# Patient Record
Sex: Male | Born: 1940 | Race: White | Hispanic: No | Marital: Married | Smoking: Never smoker
Health system: Southern US, Community
[De-identification: ages and names within clinical notes are randomized; demographics above are authoritative.]

## PROBLEM LIST (undated history)

## (undated) DIAGNOSIS — M109 Gout, unspecified: Secondary | ICD-10-CM

## (undated) DIAGNOSIS — E7800 Pure hypercholesterolemia, unspecified: Secondary | ICD-10-CM

## (undated) DIAGNOSIS — C61 Malignant neoplasm of prostate: Secondary | ICD-10-CM

## (undated) HISTORY — PX: CATARACT EXTRACTION: SUR2

## (undated) HISTORY — DX: Gout, unspecified: M10.9

## (undated) HISTORY — PX: KIDNEY STONE SURGERY: SHX686

---

## 2017-03-22 ENCOUNTER — Inpatient Hospital Stay (HOSPITAL_COMMUNITY): Payer: Medicare Other

## 2017-03-22 ENCOUNTER — Encounter (HOSPITAL_COMMUNITY): Payer: Self-pay | Admitting: Emergency Medicine

## 2017-03-22 ENCOUNTER — Inpatient Hospital Stay (HOSPITAL_COMMUNITY)
Admission: EM | Admit: 2017-03-22 | Discharge: 2017-03-24 | DRG: 872 | Disposition: A | Discharge status: Home / Self Care | Payer: Medicare Other | Attending: Internal Medicine | Admitting: Internal Medicine

## 2017-03-22 DIAGNOSIS — R338 Other retention of urine: Secondary | ICD-10-CM | POA: Diagnosis not present

## 2017-03-22 DIAGNOSIS — R339 Retention of urine, unspecified: Secondary | ICD-10-CM | POA: Diagnosis present

## 2017-03-22 DIAGNOSIS — E7800 Pure hypercholesterolemia, unspecified: Secondary | ICD-10-CM | POA: Diagnosis present

## 2017-03-22 DIAGNOSIS — N12 Tubulo-interstitial nephritis, not specified as acute or chronic: Secondary | ICD-10-CM | POA: Diagnosis present

## 2017-03-22 DIAGNOSIS — N139 Obstructive and reflux uropathy, unspecified: Secondary | ICD-10-CM | POA: Diagnosis present

## 2017-03-22 DIAGNOSIS — N3000 Acute cystitis without hematuria: Secondary | ICD-10-CM | POA: Diagnosis not present

## 2017-03-22 DIAGNOSIS — N10 Acute tubulo-interstitial nephritis: Secondary | ICD-10-CM | POA: Diagnosis not present

## 2017-03-22 DIAGNOSIS — Z792 Long term (current) use of antibiotics: Secondary | ICD-10-CM

## 2017-03-22 DIAGNOSIS — A419 Sepsis, unspecified organism: Secondary | ICD-10-CM | POA: Diagnosis present

## 2017-03-22 DIAGNOSIS — C61 Malignant neoplasm of prostate: Secondary | ICD-10-CM | POA: Diagnosis present

## 2017-03-22 DIAGNOSIS — E876 Hypokalemia: Secondary | ICD-10-CM | POA: Diagnosis present

## 2017-03-22 DIAGNOSIS — E869 Volume depletion, unspecified: Secondary | ICD-10-CM | POA: Diagnosis present

## 2017-03-22 DIAGNOSIS — N390 Urinary tract infection, site not specified: Secondary | ICD-10-CM | POA: Diagnosis present

## 2017-03-22 DIAGNOSIS — N179 Acute kidney failure, unspecified: Secondary | ICD-10-CM | POA: Diagnosis present

## 2017-03-22 DIAGNOSIS — Z79899 Other long term (current) drug therapy: Secondary | ICD-10-CM | POA: Diagnosis not present

## 2017-03-22 DIAGNOSIS — R509 Fever, unspecified: Secondary | ICD-10-CM | POA: Diagnosis not present

## 2017-03-22 DIAGNOSIS — E785 Hyperlipidemia, unspecified: Secondary | ICD-10-CM | POA: Diagnosis present

## 2017-03-22 HISTORY — DX: Malignant neoplasm of prostate: C61

## 2017-03-22 HISTORY — DX: Pure hypercholesterolemia, unspecified: E78.00

## 2017-03-22 LAB — BASIC METABOLIC PANEL
Anion gap: 11 (ref 5–15)
BUN: 29 mg/dL — AB (ref 6–20)
CHLORIDE: 97 mmol/L — AB (ref 101–111)
CO2: 26 mmol/L (ref 22–32)
Calcium: 9.7 mg/dL (ref 8.9–10.3)
Creatinine, Ser: 1.87 mg/dL — ABNORMAL HIGH (ref 0.61–1.24)
GFR calc Af Amer: 39 mL/min — ABNORMAL LOW (ref >60)
GFR calc non Af Amer: 34 mL/min — ABNORMAL LOW (ref >60)
GLUCOSE: 124 mg/dL — AB (ref 65–99)
POTASSIUM: 3.9 mmol/L (ref 3.5–5.1)
Sodium: 134 mmol/L — ABNORMAL LOW (ref 135–145)

## 2017-03-22 LAB — CBC WITH DIFFERENTIAL/PLATELET
Basophils Absolute: 0 10*3/uL (ref 0.0–0.1)
Basophils Relative: 0 %
Eosinophils Absolute: 0 10*3/uL (ref 0.0–0.7)
Eosinophils Relative: 0 %
HCT: 37.6 % — ABNORMAL LOW (ref 39.0–52.0)
HEMOGLOBIN: 13.1 g/dL (ref 13.0–17.0)
LYMPHS ABS: 1.1 10*3/uL (ref 0.7–4.0)
LYMPHS PCT: 6 %
MCH: 32.7 pg (ref 26.0–34.0)
MCHC: 34.8 g/dL (ref 30.0–36.0)
MCV: 93.8 fL (ref 78.0–100.0)
Monocytes Absolute: 1.6 10*3/uL — ABNORMAL HIGH (ref 0.1–1.0)
Monocytes Relative: 8 %
NEUTROS ABS: 16.5 10*3/uL — AB (ref 1.7–7.7)
NEUTROS PCT: 86 %
Platelets: 227 10*3/uL (ref 150–400)
RBC: 4.01 MIL/uL — AB (ref 4.22–5.81)
RDW: 12.6 % (ref 11.5–15.5)
WBC: 19.1 10*3/uL — AB (ref 4.0–10.5)

## 2017-03-22 LAB — URINALYSIS, ROUTINE W REFLEX MICROSCOPIC
Bilirubin Urine: NEGATIVE
Glucose, UA: NEGATIVE mg/dL
Ketones, ur: NEGATIVE mg/dL
Nitrite: POSITIVE — AB
Protein, ur: 100 mg/dL — AB
SPECIFIC GRAVITY, URINE: 1.008 (ref 1.005–1.030)
pH: 5 (ref 5.0–8.0)

## 2017-03-22 LAB — LACTIC ACID, PLASMA: LACTIC ACID, VENOUS: 1 mmol/L (ref 0.5–1.9)

## 2017-03-22 MED ORDER — HYDROCODONE-ACETAMINOPHEN 5-325 MG PO TABS: 1.0000 | ORAL_TABLET | Freq: Four times a day (QID) | ORAL | Status: DC | PRN

## 2017-03-22 MED ORDER — SIMVASTATIN 20 MG PO TABS
40.0000 mg | ORAL_TABLET | Freq: Every day | ORAL | Status: DC
  Administered 2017-03-22 – 2017-03-23 (×2): 40 mg via ORAL
  Filled 2017-03-22 (×2): qty 2

## 2017-03-22 MED ORDER — ONDANSETRON HCL 4 MG/2ML IJ SOLN: 4.0000 mg | Freq: Four times a day (QID) | INTRAMUSCULAR | Status: DC | PRN

## 2017-03-22 MED ORDER — DEXTROSE 5 % IV SOLN
2.0000 g | Freq: Once | INTRAVENOUS | Status: AC
  Administered 2017-03-22: 2 g via INTRAVENOUS
  Filled 2017-03-22: qty 2

## 2017-03-22 MED ORDER — ONDANSETRON HCL 4 MG/2ML IJ SOLN
4.0000 mg | Freq: Once | INTRAMUSCULAR | Status: DC
  Filled 2017-03-22: qty 2

## 2017-03-22 MED ORDER — ACETAMINOPHEN 325 MG PO TABS
650.0000 mg | ORAL_TABLET | Freq: Four times a day (QID) | ORAL | Status: DC | PRN
  Administered 2017-03-22: 650 mg via ORAL
  Filled 2017-03-22: qty 2

## 2017-03-22 MED ORDER — DEXTROSE 5 % IV SOLN
2.0000 g | INTRAVENOUS | Status: DC
  Administered 2017-03-23: 2 g via INTRAVENOUS
  Filled 2017-03-22 (×2): qty 2

## 2017-03-22 MED ORDER — ENOXAPARIN SODIUM 40 MG/0.4ML ~~LOC~~ SOLN
40.0000 mg | SUBCUTANEOUS | Status: DC
  Administered 2017-03-22 – 2017-03-23 (×2): 40 mg via SUBCUTANEOUS
  Filled 2017-03-22 (×2): qty 0.4

## 2017-03-22 MED ORDER — ONDANSETRON HCL 4 MG PO TABS: 4.0000 mg | ORAL_TABLET | Freq: Four times a day (QID) | ORAL | Status: DC | PRN

## 2017-03-22 MED ORDER — ACETAMINOPHEN 650 MG RE SUPP: 650.0000 mg | Freq: Four times a day (QID) | RECTAL | Status: DC | PRN

## 2017-03-22 MED ORDER — TAMSULOSIN HCL 0.4 MG PO CAPS
0.4000 mg | ORAL_CAPSULE | Freq: Every day | ORAL | Status: DC
  Administered 2017-03-23 – 2017-03-24 (×2): 0.4 mg via ORAL
  Filled 2017-03-22 (×2): qty 1

## 2017-03-22 MED ORDER — CO Q 10 100 MG PO CAPS: ORAL_CAPSULE | Freq: Every day | ORAL | Status: DC

## 2017-03-22 MED ORDER — SODIUM CHLORIDE 0.9 % IV BOLUS (SEPSIS)
1000.0000 mL | Freq: Once | INTRAVENOUS | Status: AC
  Administered 2017-03-22: 1000 mL via INTRAVENOUS

## 2017-03-22 MED ORDER — ACETAMINOPHEN 325 MG PO TABS
650.0000 mg | ORAL_TABLET | Freq: Once | ORAL | Status: AC
  Administered 2017-03-22: 650 mg via ORAL
  Filled 2017-03-22: qty 2

## 2017-03-22 NOTE — Progress Notes (Signed)

## 2017-03-22 NOTE — ED Provider Notes (Signed)
[place] DEPT Provider Note   CSN: [id number] Arrival date & time: 03/22/17  1414     History   Chief Complaint Chief Complaint  Patient presents with  . Fever    HPI Brian Love is a 76 y.o. male.Chief complaint is fevers shakes and chills  HPI 76 year old male with prostate cancer diagnosed December 2017. Completed 25 courses of radiation therapy on June 21. A Foley catheter after radium implants were placed. Developed UTI and placed on Macrobid 2 weeks ago. His last dose on Sunday, 5 days ago. Removed catheter Monday, 4 days ago. Has been urinating frequently and well since that time. Felt warm yesterday and had temp of 102 last night. Was seen yesterday at his primary care physician's office because of some early morning fever. He and family state that his urine was "very impressive" per the report of his primary care physician's office. No culture was sent.  Fever shakes chills this morning. He presents here.  He states that he was so weak and shaking so violently at home and on the way and he did not feel he could "make it" anywhere else.Marland Kitchen Has been wearing a depends because of his urinary frequency. Does not currently have indwelling Foley.  Temp with my exam 102.7  Past Medical History:  Diagnosis Date  . High cholesterol   . Prostate cancer ([place])     There are no active problems to display for this patient.   Past Surgical History:  Procedure Laterality Date  . CATARACT EXTRACTION    . KIDNEY STONE SURGERY         Home Medications    Prior to Admission medications   Not on File    Family History History reviewed. No pertinent family history.  Social History Social History  Substance Use Topics  . Smoking status: Never Smoker  . Smokeless tobacco: Never Used  . Alcohol use Yes     Comment: occasionally     Allergies   Patient has no known allergies.   Review of Systems Review of Systems  Constitutional: Positive for chills and  fever. Negative for appetite change, diaphoresis and fatigue.  HENT: Negative for mouth sores, sore throat and trouble swallowing.   Eyes: Negative for visual disturbance.  Respiratory: Negative for cough, chest tightness, shortness of breath and wheezing.   Cardiovascular: Negative for chest pain.  Gastrointestinal: Negative for abdominal distention, abdominal pain, diarrhea, nausea and vomiting.  Endocrine: Negative for polydipsia, polyphagia and polyuria.  Genitourinary: Negative for dysuria, frequency and hematuria.  Musculoskeletal: Negative for gait problem.  Skin: Negative for color change, pallor and rash.  Neurological: Positive for weakness. Negative for dizziness, syncope, light-headedness and headaches.  Hematological: Does not bruise/bleed easily.  Psychiatric/Behavioral: Negative for behavioral problems and confusion.     Physical Exam Updated Vital Signs BP (!) 146/78 (BP Location: Right Arm)   Pulse 92   Temp 100.1 F (37.8 C) (Oral)   Resp 16   Ht 5\' 10"  (1.778 m)   Wt 85.7 kg (189 lb)   SpO2 94%   BMI 27.12 kg/m   Physical Exam  Constitutional: He is oriented to person, place, and time. He appears well-developed and well-nourished. No distress.  Temperature 102.7.  HENT:  Head: Normocephalic.  Eyes: Conjunctivae are normal. Pupils are equal, round, and reactive to light. No scleral icterus.  Neck: Normal range of motion. Neck supple. No thyromegaly present.  Cardiovascular: Normal rate and regular rhythm.  Exam reveals no gallop and no  friction rub.   No murmur heard. Pulmonary/Chest: Effort normal and breath sounds normal. No respiratory distress. He has no wheezes. He has no rales.  Abdominal: Soft. Bowel sounds are normal. He exhibits no distension. There is no tenderness. There is no rebound.  Musculoskeletal: Normal range of motion.  Neurological: He is alert and oriented to person, place, and time.  Skin: Skin is warm and dry. No rash noted.    Psychiatric: He has a normal mood and affect. His behavior is normal.     ED Treatments / Results  Labs (all labs ordered are listed, but only abnormal results are displayed) Labs Reviewed  URINALYSIS, ROUTINE W REFLEX MICROSCOPIC - Abnormal; Notable for the following:       Result Value   APPearance TURBID (*)    Hgb urine dipstick MODERATE (*)    Protein, ur 100 (*)    Nitrite POSITIVE (*)    Leukocytes, UA MODERATE (*)    Bacteria, UA FEW (*)    Squamous Epithelial / LPF 0-5 (*)    Non Squamous Epithelial 0-5 (*)    All other components within normal limits  CBC WITH DIFFERENTIAL/PLATELET - Abnormal; Notable for the following:    WBC 19.1 (*)    RBC 4.01 (*)    HCT 37.6 (*)    Neutro Abs 16.5 (*)    Monocytes Absolute 1.6 (*)    All other components within normal limits  BASIC METABOLIC PANEL - Abnormal; Notable for the following:    Sodium 134 (*)    Chloride 97 (*)    Glucose, Bld 124 (*)    BUN 29 (*)    Creatinine, Ser 1.87 (*)    GFR calc non Af Amer 34 (*)    GFR calc Af Amer 39 (*)    All other components within normal limits  URINE CULTURE  CULTURE, BLOOD (ROUTINE X 2)  CULTURE, BLOOD (ROUTINE X 2)  LACTIC ACID, PLASMA  LACTIC ACID, PLASMA    EKG  EKG Interpretation None       Radiology No results found.  Procedures Procedures (including critical care time)  Medications Ordered in ED Medications  ondansetron (ZOFRAN) injection 4 mg (4 mg Intravenous Refused 03/22/17 1716)  sodium chloride 0.9 % bolus 1,000 mL (0 mLs Intravenous Stopped 03/22/17 1704)  cefTRIAXone (ROCEPHIN) 2 g in dextrose 5 % 50 mL IVPB (0 g Intravenous Stopped 03/22/17 1556)  acetaminophen (TYLENOL) tablet 650 mg (650 mg Oral Given 03/22/17 1503)     Initial Impression / Assessment and Plan / ED Course  I have reviewed the triage vital signs and the nursing notes.  Pertinent labs & imaging results that were available during my care of the patient were reviewed by me and  considered in my medical decision making (see chart for details).   Plan fluids, labs including lactate blood and urine cultures urinalysis. IV Rocephin. We'll reevaluate after the above.  17:15:  He became nauseated and had an episode of emesis. Overall continues to appear well. Not tachycardic. Reassuring lactate. Leukocytosis of 19,000. Creatinine 1.87. He does not know his baseline. Urine appears infected. Blood and cultures are pending. He was given 2 g IV Rocephin.  I requested an ultrasound to ensure that there is no obstruction. He continues to have urinary frequency and feels as though he is emptying. With emesis, and probable bacteremia after 24 hours of Levaquin feel that admission for fluids, recheck creatinine, and additional antibiotics are indicated. We'll discuss with hospitalist.  Final Clinical Impressions(s) / ED Diagnoses   Final diagnoses:  Lower urinary tract infectious disease  Acute kidney injury ([place])  Fever, unspecified fever cause    New Prescriptions New Prescriptions   No medications on file     Tanna Furry, MD 03/22/17 1717

## 2017-03-22 NOTE — ED Notes (Addendum)
Pt is now throwing up in trash can. Will alert MD

## 2017-03-22 NOTE — ED Triage Notes (Signed)
Patient states he is being treated for UTI and was told by PCP if he was no better to go to ER or [facility]. Patient complaining of fever and lower abdominal pain. Patient recently finished radiation for prostate cancer.

## 2017-03-22 NOTE — H&P (Signed)
History and Physical  Brian Love QPY:[id number] DOB: 06/30/1941 DOA: 03/22/2017  Referring physician: Dr Jeneen Rinks, ED physician PCP: Glenda Chroman, MD  Outpatient Specialists:   Dr Rosana Hoes (Urology with [place])  [facility] Med Radiation oncology  Patient Coming From: home  Chief Complaint: Fever, chills  HPI: Brian Love is a 76 y.o. male with a history of cancer and high cholesterol. Patient on brachy therapy his prostate cancer, which was initiated on 02/20/17. After placement of brachytherapy, the patient had Foley catheter placed - he removed it as instructed but experienced frequency and urinary retention. The Foley was replaced and the patient returned on 6/13 for voiding trial, which initially passed, then again developed frequency and retention. He presented to emergency department on 6/24 for Foley placement and was placed on nitrofurantoin for bladder infection. He was seen in his urologist office on 6/27, instructed to take ibuprofen for 3 days, then remove Foley catheter. After removing the Foley catheter, the patient has continued to have frequency. After stopping the Macrobid, the patient began to have fevers and he presented at his PCP for evaluation and was given a prescription for levaquin. Per patient, no cultures were sent. Last night, the patient had elevated temperatures to 102, which improved with Tylenol and ibuprofen. No provoking factors. Also had episode of vomiting early this morning.  Emergency Department Course: Blood cultures, urine cultures obtained. Patient given ceftriaxone 2 g in the ED. Had episode of vomiting in the ED.  Review of Systems:   Pt denies any diarrhea, constipation, abdominal pain, shortness of breath, dyspnea on exertion, orthopnea, cough, wheezing, palpitations, headache, vision changes, lightheadedness, dizziness, melena, rectal bleeding.  Review of systems are otherwise negative  Past Medical History:  Diagnosis Date  . High cholesterol     . Prostate cancer [facility])    Past Surgical History:  Procedure Laterality Date  . CATARACT EXTRACTION    . KIDNEY STONE SURGERY     Social History:  reports that he has never smoked. He has never used smokeless tobacco. He reports that he drinks alcohol. He reports that he does not use drugs. Patient lives at Home  No Known Allergies  History reviewed. No pertinent family history.  Prior to Admission medications   Medication Sig Start Date End Date Taking? Authorizing Provider  Coenzyme Q10 (CO Q 10 PO) Take 1 capsule by mouth daily.   Yes [provider]  glucosamine-chondroitin 500-400 MG tablet Take 1 tablet by mouth 2 (two) times daily.    Yes [provider]  HYDROcodone-acetaminophen (NORCO/VICODIN) 5-325 MG tablet Take 1 tablet by mouth every 6 (six) hours as needed for moderate pain.   Yes [provider]  levofloxacin (LEVAQUIN) 500 MG tablet Take 500 mg by mouth daily. 10 day course starting on 03/21/2017   Yes [provider]  simvastatin (ZOCOR) 40 MG tablet Take 40 mg by mouth at bedtime.   Yes [provider]  tamsulosin (FLOMAX) 0.4 MG CAPS capsule Take 0.4 mg by mouth daily.   Yes [provider]    Physical Exam: BP (!) 150/75 (BP Location: Right Arm)   Pulse 90   Temp 99.5 F (37.5 C) (Oral)   Resp 19   Ht 5\' 10"  (1.778 m)   Wt 87.7 kg (193 lb 4.8 oz)   SpO2 95%   BMI 27.74 kg/m   General: Older Caucasian male. Awake and alert and oriented x3. No acute cardiopulmonary distress.  HEENT: Normocephalic atraumatic.  Right  and left ears normal in appearance.  Pupils equal, round, reactive to light. Extraocular muscles are intact. Sclerae anicteric and noninjected.  Moist mucosal membranes. No mucosal lesions.  Neck: Neck supple without lymphadenopathy. No carotid bruits. No masses palpated.  Cardiovascular: Regular rate with normal S1-S2 sounds. No murmurs, rubs, gallops auscultated. No JVD.  Respiratory: Good  respiratory effort with no wheezes, rales, rhonchi. Lungs clear to auscultation bilaterally.  No accessory muscle use. Abdomen: Soft, suprapubic tenderness, nondistended. Active bowel sounds. No masses or hepatosplenomegaly, although body habitus makes exam difficult  Skin: No rashes, lesions, or ulcerations.  Dry, warm to touch. 2+ dorsalis pedis and radial pulses. Musculoskeletal: No calf or leg pain. All major joints not erythematous nontender.  No upper or lower joint deformation.  Good ROM.  No contractures  Psychiatric: Intact judgment and insight. Pleasant and cooperative. Neurologic: No focal neurological deficits. Strength is 5/5 and symmetric in upper and lower extremities.  Cranial nerves II through XII are grossly intact.           Labs on Admission: I have personally reviewed following labs and imaging studies  CBC:  Recent Labs Lab 03/22/17 1509  WBC 19.1*  NEUTROABS 16.5*  HGB 13.1  HCT 37.6*  MCV 93.8  PLT 811   Basic Metabolic Panel:  Recent Labs Lab 03/22/17 1509  NA 134*  K 3.9  CL 97*  CO2 26  GLUCOSE 124*  BUN 29*  CREATININE 1.87*  CALCIUM 9.7   GFR: Estimated Creatinine Clearance: 38.1 mL/min (A) (by C-G formula based on SCr of 1.87 mg/dL (H)). Liver Function Tests: No results for input(s): AST, ALT, ALKPHOS, BILITOT, PROT, ALBUMIN in the last 168 hours. No results for input(s): LIPASE, AMYLASE in the last 168 hours. No results for input(s): AMMONIA in the last 168 hours. Coagulation Profile: No results for input(s): INR, PROTIME in the last 168 hours. Cardiac Enzymes: No results for input(s): CKTOTAL, CKMB, CKMBINDEX, TROPONINI in the last 168 hours. BNP (last 3 results) No results for input(s): PROBNP in the last 8760 hours. HbA1C: No results for input(s): HGBA1C in the last 72 hours. CBG: No results for input(s): GLUCAP in the last 168 hours. Lipid Profile: No results for input(s): CHOL, HDL, LDLCALC, TRIG, CHOLHDL, LDLDIRECT in the  last 72 hours. Thyroid Function Tests: No results for input(s): TSH, T4TOTAL, FREET4, T3FREE, THYROIDAB in the last 72 hours. Anemia Panel: No results for input(s): VITAMINB12, FOLATE, FERRITIN, TIBC, IRON, RETICCTPCT in the last 72 hours. Urine analysis:    Component Value Date/Time   COLORURINE YELLOW 03/22/2017 1448   APPEARANCEUR TURBID (A) 03/22/2017 1448   LABSPEC 1.008 03/22/2017 1448   PHURINE 5.0 03/22/2017 1448   GLUCOSEU NEGATIVE 03/22/2017 1448   HGBUR MODERATE (A) 03/22/2017 1448   BILIRUBINUR NEGATIVE 03/22/2017 1448   KETONESUR NEGATIVE 03/22/2017 1448   PROTEINUR 100 (A) 03/22/2017 1448   NITRITE POSITIVE (A) 03/22/2017 1448   LEUKOCYTESUR MODERATE (A) 03/22/2017 1448   Sepsis Labs: @LABRCNTIP (procalcitonin:4,lacticidven:4) )No results found for this or any previous visit (from the past 240 hour(s)).   Radiological Exams on Admission: US Renal  Result Date: 03/22/2017 CLINICAL DATA:  Urinary retention. Previous calculi. History of prostate carcinoma EXAM: RENAL / URINARY TRACT ULTRASOUND COMPLETE COMPARISON:  None. FINDINGS: Right Kidney: Length: 12.3 cm. Echogenicity and renal cortical thickness are within normal limits. No perinephric fluid visualized. There is slight fullness of the right renal collecting system. There is a cyst arising from the upper pole of the right kidney  measuring 1.3 x 1.2 x 1.4 cm. No sonographically demonstrable calculus or ureterectasis is appreciable on the right. Left Kidney: Length: 12.5 cm. Echogenicity and renal cortical thickness are within normal limits. No perinephric fluid visualized. There is a cyst arising from the medial upper pole left kidney measuring 3.2 x 2.3 x 2.7 cm. There is a parapelvic cyst measuring 3.1 x 2.7 x 2.7 cm. There is mild pelvicaliectasis. There are several calculi in the left kidney, largest measuring approximately 1.5 cm in size. No ureterectasis. Bladder: Urinary bladder is distended with a measured volume of  547 cubic cm. No lesion within the urinary bladder is evident. IMPRESSION: Distended urinary bladder consistent with stated diagnosis of urinary retention. Slight fullness of each renal collecting system may be due to the distention of the urinary bladder. There are cysts in each kidney. There are nonobstructing calculi in the left kidney. No ureterectasis evident by ultrasound. Electronically Signed   By: Lowella Grip III M.D.   On: 03/22/2017 18:49     Assessment/Plan: Principal Problem:   UTI (urinary tract infection) Active Problems:   Prostate cancer ([place])   Acute kidney injury ([place])   Fever   High cholesterol   Acute urinary retention    This patient was discussed with the ED physician, including pertinent vitals, physical exam findings, labs, and imaging.  We also discussed care given by the ED provider.  #1 UTI  Admit  Urine cultures, blood cultures pending  Ceftriaxone 2 g every 24 #2 Fever  Antipyretics #3 AKI  Likely secondary to obstruction  Replace foley  Recheck Creatinine in AM #4 Prostate Cancer  Consult urology #5 Urinary Retention  Replace foley  DVT prophylaxis: Lovenox Consultants: urology Code Status: Full code Family Communication: Wife in the room  Disposition Plan: Admission   Truett Mainland, DO [facility] Pager [phone]  If 7PM-7AM, please contact night-coverage [email or web address] Password [email or web address]

## 2017-03-22 NOTE — ED Notes (Signed)
Patient transported to Ultrasound 

## 2017-03-23 DIAGNOSIS — C61 Malignant neoplasm of prostate: Secondary | ICD-10-CM

## 2017-03-23 DIAGNOSIS — A419 Sepsis, unspecified organism: Secondary | ICD-10-CM

## 2017-03-23 DIAGNOSIS — N179 Acute kidney failure, unspecified: Secondary | ICD-10-CM

## 2017-03-23 DIAGNOSIS — N10 Acute tubulo-interstitial nephritis: Secondary | ICD-10-CM

## 2017-03-23 DIAGNOSIS — R338 Other retention of urine: Secondary | ICD-10-CM

## 2017-03-23 DIAGNOSIS — E876 Hypokalemia: Secondary | ICD-10-CM

## 2017-03-23 LAB — CBC
HCT: 33.4 % — ABNORMAL LOW (ref 39.0–52.0)
HEMOGLOBIN: 11.8 g/dL — AB (ref 13.0–17.0)
MCH: 33.1 pg (ref 26.0–34.0)
MCHC: 35.3 g/dL (ref 30.0–36.0)
MCV: 93.6 fL (ref 78.0–100.0)
Platelets: 215 10*3/uL (ref 150–400)
RBC: 3.57 MIL/uL — ABNORMAL LOW (ref 4.22–5.81)
RDW: 12.4 % (ref 11.5–15.5)
WBC: 12.3 10*3/uL — ABNORMAL HIGH (ref 4.0–10.5)

## 2017-03-23 LAB — BASIC METABOLIC PANEL
ANION GAP: 8 (ref 5–15)
BUN: 21 mg/dL — ABNORMAL HIGH (ref 6–20)
CALCIUM: 9.2 mg/dL (ref 8.9–10.3)
CO2: 28 mmol/L (ref 22–32)
Chloride: 104 mmol/L (ref 101–111)
Creatinine, Ser: 1.32 mg/dL — ABNORMAL HIGH (ref 0.61–1.24)
GFR, EST AFRICAN AMERICAN: 59 mL/min — AB (ref >60)
GFR, EST NON AFRICAN AMERICAN: 51 mL/min — AB (ref >60)
GLUCOSE: 126 mg/dL — AB (ref 65–99)
Potassium: 3.4 mmol/L — ABNORMAL LOW (ref 3.5–5.1)
SODIUM: 140 mmol/L (ref 135–145)

## 2017-03-23 MED ORDER — POTASSIUM CHLORIDE IN NACL 20-0.9 MEQ/L-% IV SOLN
INTRAVENOUS | Status: AC
  Administered 2017-03-23: 11:00:00 via INTRAVENOUS

## 2017-03-23 MED ORDER — POTASSIUM CHLORIDE CRYS ER 10 MEQ PO TBCR
10.0000 meq | EXTENDED_RELEASE_TABLET | Freq: Once | ORAL | Status: AC
Start: 2017-03-23 — End: 2017-03-23
  Administered 2017-03-23: 10 meq via ORAL
  Filled 2017-03-23: qty 1

## 2017-03-23 NOTE — Progress Notes (Addendum)
PROGRESS NOTE  Brian Love NAT:[id number] DOB: 30-Sep-1940 DOA: 03/22/2017 PCP: Glenda Chroman, MD  Brief History26  76 year old male with a history of prostate cancer and hyperlipidemia presented with fevers, nausea, and vomiting of 2 days' duration. The patient was initiated on Brachytherapy on 02/20/2017 by Dr. Tresa Endo at [facility].  Since that period of time, the patient has had difficulty with urinary retention when his Foley catheter is removed. The patient had a voiding trial on 02/27/2017 which he passed. Unfortunately, after his Foley catheter was removed, he began developing urinary frequency and retention once again. He presented again to the emergency department on 03/10/2017 at which time we will catheter was placed and the patient was started on nitrofurantoin. He followed up with urology on 03/13/2017. The patient was instructed to take ibuprofen to decrease inflammation. He removed his Foley catheter at home on 03/18/2017. Again, the patient began having symptoms of urinary retention urinating only approximately 15-30 mL each time. The patient began developing fevers up to 1.16F on the evening of 03/20/2017. He went to see his primary care physician, Dr. Woody Seller on 03/21/17.  At that time, the patient was placed on levofloxacin. He stated that her urinalysis was obtained, but no urine culture was sent. The patient took 2 doses of levofloxacin. Unfortunately, he continued to have urinary frequency and fevers with associated rigors since removing his foley catheter on 03/18/17.  As a result, he presented to emergency department for evaluation. He was found to have WBC. 19.1 with a temperature 100.40F. Lactic acid was 1.0. The patient was started on ceftriaxone.  Assessment/Plan: Sepsis -present at time of admission -presented with fever, leukocytosis, AKI with urinary source of infection -lactate 1.0 -continue ceftriaxone pending culture data  Pyelonephritis -Continue ceftriaxone  pending culture data -Suspect the cultures may remain negative as the patient was on levofloxacin prior to admission -Restart IV fluids as pt remains clinically volume depleted  AKI -Multifactorial including sepsis, volume depletion, and obstructive uropathy -Improving with IV fluids -Restart IV fluids as pt remains clinically volume depleted -03/22/2017 renal ultrasound--distended urinary bladder with slight fullness of the bilateral renal collecting system  Urine retention -foley catheter place in ED 03/22/17 -maintain foley catheter for now -given recent history of recurrent problems with urine retention, will plan to keep foley catheter until he follows up with urology as outpt for repeat voiding trial or urodynamic studies -continue flomax  Hyperlipidemia -Continue statin  Prostate Cancer -Brachytherapy initiated 02/20/17--Dr. Tresa Endo, III at [facility]  Hypokalemia -replete -check mag   Disposition Plan:   Home in 1-2 days  Family Communication:  No Family at bedside--Total time spent 35 minutes.  Greater than 50% spent face to face counseling and coordinating care.   Consultants:  none  Code Status:  FULL   DVT Prophylaxis:  [place] Lovenox   Procedures: As Listed in Progress Note Above  Antibiotics: Ceftriaxone 7/6>>>    Subjective: Patient denies fevers, chills, headache, chest pain, dyspnea, nausea, vomiting, diarrhea, abdominal pain, dysuria, hematuria, hematochezia, and melena. Patient had 90% of his breakfast this morning.   Objective: Vitals:   03/22/17 2323 03/23/17 0020 03/23/17 0539 03/23/17 0829  BP: 120/60  117/63   Pulse: 84  82 86  Resp: 17  15   Temp: (!) 100.4 F (38 C) 98 F (36.7 C) 98.7 F (37.1 C)   TempSrc:   Oral   SpO2: 94%  96% 93%  Weight:  Height:        Intake/Output Summary (Last 24 hours) at 03/23/17 0908 Last data filed at 03/23/17 0540  Gross per 24 hour  Intake                0 ml  Output             2300 ml    Net            -2300 ml   Weight change:  Exam:   General:  Pt is alert, follows commands appropriately, not in acute distress  HEENT: No icterus, No thrush, No neck mass, [place]/AT  Cardiovascular: RRR, S1/S2, no rubs, no gallops  Respiratory: CTA bilaterally, no wheezing, no crackles, no rhonchi  Abdomen: Soft/+BS, non tender, non distended, no guarding  Extremities: No edema, No lymphangitis, No petechiae, No rashes, no synovitis   Data Reviewed: I have personally reviewed following labs and imaging studies Basic Metabolic Panel:  Recent Labs Lab 03/22/17 1509 03/23/17 0615  NA 134* 140  K 3.9 3.4*  CL 97* 104  CO2 26 28  GLUCOSE 124* 126*  BUN 29* 21*  CREATININE 1.87* 1.32*  CALCIUM 9.7 9.2   Liver Function Tests: No results for input(s): AST, ALT, ALKPHOS, BILITOT, PROT, ALBUMIN in the last 168 hours. No results for input(s): LIPASE, AMYLASE in the last 168 hours. No results for input(s): AMMONIA in the last 168 hours. Coagulation Profile: No results for input(s): INR, PROTIME in the last 168 hours. CBC:  Recent Labs Lab 03/22/17 1509 03/23/17 0615  WBC 19.1* 12.3*  NEUTROABS 16.5*  --   HGB 13.1 11.8*  HCT 37.6* 33.4*  MCV 93.8 93.6  PLT 227 215   Cardiac Enzymes: No results for input(s): CKTOTAL, CKMB, CKMBINDEX, TROPONINI in the last 168 hours. BNP: Invalid input(s): POCBNP CBG: No results for input(s): GLUCAP in the last 168 hours. HbA1C: No results for input(s): HGBA1C in the last 72 hours. Urine analysis:    Component Value Date/Time   COLORURINE YELLOW 03/22/2017 1448   APPEARANCEUR TURBID (A) 03/22/2017 1448   LABSPEC 1.008 03/22/2017 1448   PHURINE 5.0 03/22/2017 1448   GLUCOSEU NEGATIVE 03/22/2017 1448   HGBUR MODERATE (A) 03/22/2017 1448   BILIRUBINUR NEGATIVE 03/22/2017 1448   KETONESUR NEGATIVE 03/22/2017 1448   PROTEINUR 100 (A) 03/22/2017 1448   NITRITE POSITIVE (A) 03/22/2017 1448   LEUKOCYTESUR MODERATE (A) 03/22/2017  1448   Sepsis Labs: @LABRCNTIP (procalcitonin:4,lacticidven:4) ) Recent Results (from the past 240 hour(s))  Culture, blood (Routine X 2) w Reflex to ID Panel     Status: None (Preliminary result)   Collection Time: 03/22/17  3:09 PM  Result Value Ref Range Status   Specimen Description BLOOD  Final   Special Requests NONE  Final   Culture NO GROWTH < 24 HOURS  Final   Report Status PENDING  Incomplete  Culture, blood (Routine X 2) w Reflex to ID Panel     Status: None (Preliminary result)   Collection Time: 03/22/17  3:09 PM  Result Value Ref Range Status   Specimen Description BLOOD  Final   Special Requests NONE  Final   Culture NO GROWTH < 24 HOURS  Final   Report Status PENDING  Incomplete     Scheduled Meds: . enoxaparin (LOVENOX) injection  40 mg Subcutaneous Q24H  . ondansetron (ZOFRAN) IV  4 mg Intravenous Once  . simvastatin  40 mg Oral QHS  . tamsulosin  0.4 mg Oral Daily  Continuous Infusions: . cefTRIAXone (ROCEPHIN)  IV      Procedures/Studies: US Renal  Result Date: 03/22/2017 CLINICAL DATA:  Urinary retention. Previous calculi. History of prostate carcinoma EXAM: RENAL / URINARY TRACT ULTRASOUND COMPLETE COMPARISON:  None. FINDINGS: Right Kidney: Length: 12.3 cm. Echogenicity and renal cortical thickness are within normal limits. No perinephric fluid visualized. There is slight fullness of the right renal collecting system. There is a cyst arising from the upper pole of the right kidney measuring 1.3 x 1.2 x 1.4 cm. No sonographically demonstrable calculus or ureterectasis is appreciable on the right. Left Kidney: Length: 12.5 cm. Echogenicity and renal cortical thickness are within normal limits. No perinephric fluid visualized. There is a cyst arising from the medial upper pole left kidney measuring 3.2 x 2.3 x 2.7 cm. There is a parapelvic cyst measuring 3.1 x 2.7 x 2.7 cm. There is mild pelvicaliectasis. There are several calculi in the left kidney, largest  measuring approximately 1.5 cm in size. No ureterectasis. Bladder: Urinary bladder is distended with a measured volume of 547 cubic cm. No lesion within the urinary bladder is evident. IMPRESSION: Distended urinary bladder consistent with stated diagnosis of urinary retention. Slight fullness of each renal collecting system may be due to the distention of the urinary bladder. There are cysts in each kidney. There are nonobstructing calculi in the left kidney. No ureterectasis evident by ultrasound. Electronically Signed   By: Lowella Grip III M.D.   On: 03/22/2017 18:49    Demeshia Sherburne, DO  [facility] Pager [phone]  If 7PM-7AM, please contact night-coverage [email or web address] Password [email or web address] 03/23/2017, 9:08 AM   LOS: 1 day

## 2017-03-24 DIAGNOSIS — E876 Hypokalemia: Secondary | ICD-10-CM

## 2017-03-24 LAB — CBC
HCT: 34 % — ABNORMAL LOW (ref 39.0–52.0)
Hemoglobin: 11.5 g/dL — ABNORMAL LOW (ref 13.0–17.0)
MCH: 32.3 pg (ref 26.0–34.0)
MCHC: 33.8 g/dL (ref 30.0–36.0)
MCV: 95.5 fL (ref 78.0–100.0)
PLATELETS: 226 10*3/uL (ref 150–400)
RBC: 3.56 MIL/uL — AB (ref 4.22–5.81)
RDW: 12.2 % (ref 11.5–15.5)
WBC: 8.5 10*3/uL (ref 4.0–10.5)

## 2017-03-24 LAB — URINE CULTURE: Culture: NO GROWTH

## 2017-03-24 LAB — BASIC METABOLIC PANEL
ANION GAP: 7 (ref 5–15)
BUN: 19 mg/dL (ref 6–20)
CO2: 29 mmol/L (ref 22–32)
Calcium: 9.2 mg/dL (ref 8.9–10.3)
Chloride: 108 mmol/L (ref 101–111)
Creatinine, Ser: 1.08 mg/dL (ref 0.61–1.24)
GFR calc Af Amer: >60 mL/min (ref >60)
GLUCOSE: 105 mg/dL — AB (ref 65–99)
Potassium: 3.9 mmol/L (ref 3.5–5.1)
Sodium: 144 mmol/L (ref 135–145)

## 2017-03-24 LAB — MAGNESIUM: Magnesium: 1.7 mg/dL (ref 1.7–2.4)

## 2017-03-24 NOTE — Progress Notes (Signed)
Patient IV removed, tolerated well. Patient given discharge instructions at bedside. Urinary catheter left in place, as ordered per MD.

## 2017-03-24 NOTE — Discharge Summary (Signed)
Physician Discharge Summary  Brian Love SPQ:[id number] DOB: [birth year] DOA: 03/22/2017  PCP: Glenda Chroman, MD  Admit date: 03/22/2017 Discharge date: 03/24/2017  Admitted From: Home Disposition:  Home  Recommendations for Outpatient Follow-up:  1. Follow up with PCP in 1-2 weeks 2. Please obtain BMP/CBC in one week 3. Follow up [facility] this up coming week   Discharge Condition: Stable CODE STATUS: FULL Diet recommendation: Heart Healthy   Brief/Interim Summary: 76 year old male with a history of prostate cancer and hyperlipidemia presented with fevers, nausea, and vomiting of 2 days' duration. The patient was initiated on Brachytherapy on 02/20/2017 by Dr. Tresa Endo at [facility].  Since that period of time, the patient has had difficulty with urinary retention when his Foley catheter is removed. The patient had a voiding trial on 02/27/2017 which he passed. Unfortunately, after his Foley catheter was removed, he began developing urinary frequency and retention once again. He presented again to the emergency department on 03/10/2017 at which time we will catheter was placed and the patient was started on nitrofurantoin. He followed up with urology on 03/13/2017. The patient was instructed to take ibuprofen to decrease inflammation. He removed his Foley catheter at home on 03/18/2017. Again, the patient began having symptoms of urinary retention urinating only approximately 15-30 mL each time. The patient began developing fevers up to 102.38F on the evening of 03/20/2017. He went to see his primary care physician, Dr. Woody Seller on 03/21/17.  At that time, the patient was placed on levofloxacin. He stated that her urinalysis was obtained, but no urine culture was sent. The patient took 2 doses of levofloxacin. Unfortunately, he continued to have urinary frequency and fevers with associated rigors since removing his foley catheter on 03/18/17.  As a result, he presented to emergency  department for evaluation. He was found to have WBC. 19.1 with a temperature 100.16F. Lactic acid was 1.0. The patient was started on ceftriaxone.  Discharge Diagnoses:  Sepsis -present at time of admission -presented with fever, leukocytosis, AKI with urinary source of infection -lactate 1.0 -started on ceftriaxone pending culture data -blood and urine cultures remained negative at time of discharge -cultures negative likely due to levofloxacin patient was taking prior to admission  Pyelonephritis -Started ceftriaxone pending culture data -Suspect the cultures may remain negative as the patient was on levofloxacin prior to admission -Restart IV fluids as pt remains clinically volume depleted-->clinically improved -Case was discussed with Urology, Dr. Stann Ore [facility] to send home with levofloxacin with indwelling foley catheter--their office will contact patient on 03/25/17 and schedule appointment -finish 8 more days levofloxacin to complete 10 days of therapy (pt has 8 tablets left from his outpt prescription).  He was instructed to restart his levofloxacin today  AKI -Multifactorial including sepsis, volume depletion, and obstructive uropathy -presented with serum creatinine 1.87 -Improved with IV fluids -Restart IV fluids as pt remains clinically volume depleted -03/22/2017 renal ultrasound--distended urinary bladder with slight fullness of the bilateral renal collecting system -serum creatinine 1.08 at time of discharge  Urine retention -foley catheter place in ED 03/22/17-->400 cc out -maintain foley catheter for now -given recent history of recurrent problems with urine retention, will plan to keep foley catheter until he follows up with urology as outpt for repeat voiding trial or urodynamic studies -continue flomax  Hyperlipidemia -Continue statin  Prostate Cancer -Brachytherapy initiated 02/20/17--Dr. Tresa Endo, III at  [facility]  Hypokalemia -repleted -check mag--1.7   Discharge Instructions  Discharge Instructions  Diet general    Complete by:  As directed    Increase activity slowly    Complete by:  As directed      Allergies as of 03/24/2017   No Known Allergies     Medication List    TAKE these medications   CO Q 10 PO Take 1 capsule by mouth daily.   glucosamine-chondroitin 500-400 MG tablet Take 1 tablet by mouth 2 (two) times daily.   HYDROcodone-acetaminophen 5-325 MG tablet Commonly known as:  NORCO/VICODIN Take 1 tablet by mouth every 6 (six) hours as needed for moderate pain.   levofloxacin 500 MG tablet Commonly known as:  LEVAQUIN Take 500 mg by mouth daily. 10 day course starting on 03/21/2017   simvastatin 40 MG tablet Commonly known as:  ZOCOR Take 40 mg by mouth at bedtime.   tamsulosin 0.4 MG Caps capsule Commonly known as:  FLOMAX Take 0.4 mg by mouth daily.       No Known Allergies  Consultations:  [facility] on phone   Procedures/Studies: US Renal  Result Date: 03/22/2017 CLINICAL DATA:  Urinary retention. Previous calculi. History of prostate carcinoma EXAM: RENAL / URINARY TRACT ULTRASOUND COMPLETE COMPARISON:  None. FINDINGS: Right Kidney: Length: 12.3 cm. Echogenicity and renal cortical thickness are within normal limits. No perinephric fluid visualized. There is slight fullness of the right renal collecting system. There is a cyst arising from the upper pole of the right kidney measuring 1.3 x 1.2 x 1.4 cm. No sonographically demonstrable calculus or ureterectasis is appreciable on the right. Left Kidney: Length: 12.5 cm. Echogenicity and renal cortical thickness are within normal limits. No perinephric fluid visualized. There is a cyst arising from the medial upper pole left kidney measuring 3.2 x 2.3 x 2.7 cm. There is a parapelvic cyst measuring 3.1 x 2.7 x 2.7 cm. There is mild pelvicaliectasis. There are several calculi in the left kidney,  largest measuring approximately 1.5 cm in size. No ureterectasis. Bladder: Urinary bladder is distended with a measured volume of 547 cubic cm. No lesion within the urinary bladder is evident. IMPRESSION: Distended urinary bladder consistent with stated diagnosis of urinary retention. Slight fullness of each renal collecting system may be due to the distention of the urinary bladder. There are cysts in each kidney. There are nonobstructing calculi in the left kidney. No ureterectasis evident by ultrasound. Electronically Signed   By: Lowella Grip III M.D.   On: 03/22/2017 18:49        Discharge Exam: Vitals:   03/23/17 2221 03/24/17 0652  BP: (!) 176/82 126/71  Pulse: 78 72  Resp: 18 18  Temp: 98.5 F (36.9 C) 98.7 F (37.1 C)   Vitals:   03/23/17 0829 03/23/17 1300 03/23/17 2221 03/24/17 0652  BP:  (!) 106/51 (!) 176/82 126/71  Pulse: 86 73 78 72  Resp:  18 18 18   Temp:  98.4 F (36.9 C) 98.5 F (36.9 C) 98.7 F (37.1 C)  TempSrc:  Oral Oral Oral  SpO2: 93% 95% 96% 96%  Weight:      Height:        General: Pt is alert, awake, not in acute distress Cardiovascular: RRR, S1/S2 +, no rubs, no gallops Respiratory: CTA bilaterally, no wheezing, no rhonchi Abdominal: Soft, NT, ND, bowel sounds + Extremities: no edema, no cyanosis   The results of significant diagnostics from this hospitalization (including imaging, microbiology, ancillary and laboratory) are listed below for reference.    Significant Diagnostic Studies: US Renal  Result Date: 03/22/2017 CLINICAL DATA:  Urinary retention. Previous calculi. History of prostate carcinoma EXAM: RENAL / URINARY TRACT ULTRASOUND COMPLETE COMPARISON:  None. FINDINGS: Right Kidney: Length: 12.3 cm. Echogenicity and renal cortical thickness are within normal limits. No perinephric fluid visualized. There is slight fullness of the right renal collecting system. There is a cyst arising from the upper pole of the right kidney measuring  1.3 x 1.2 x 1.4 cm. No sonographically demonstrable calculus or ureterectasis is appreciable on the right. Left Kidney: Length: 12.5 cm. Echogenicity and renal cortical thickness are within normal limits. No perinephric fluid visualized. There is a cyst arising from the medial upper pole left kidney measuring 3.2 x 2.3 x 2.7 cm. There is a parapelvic cyst measuring 3.1 x 2.7 x 2.7 cm. There is mild pelvicaliectasis. There are several calculi in the left kidney, largest measuring approximately 1.5 cm in size. No ureterectasis. Bladder: Urinary bladder is distended with a measured volume of 547 cubic cm. No lesion within the urinary bladder is evident. IMPRESSION: Distended urinary bladder consistent with stated diagnosis of urinary retention. Slight fullness of each renal collecting system may be due to the distention of the urinary bladder. There are cysts in each kidney. There are nonobstructing calculi in the left kidney. No ureterectasis evident by ultrasound. Electronically Signed   By: Lowella Grip III M.D.   On: 03/22/2017 18:49     Microbiology: Recent Results (from the past 240 hour(s))  Urine Culture     Status: None   Collection Time: 03/22/17  2:48 PM  Result Value Ref Range Status   Specimen Description URINE, CLEAN CATCH  Final   Special Requests NONE  Final   Culture   Final    NO GROWTH Performed at [facility], [address], [place], [place] [address]    Report Status 03/24/2017 FINAL  Final  Culture, blood (Routine X 2) w Reflex to ID Panel     Status: None (Preliminary result)   Collection Time: 03/22/17  3:09 PM  Result Value Ref Range Status   Specimen Description BLOOD LEFT HAND  Final   Special Requests   Final    BOTTLES DRAWN AEROBIC AND ANAEROBIC Blood Culture adequate volume   Culture NO GROWTH 2 DAYS  Final   Report Status PENDING  Incomplete  Culture, blood (Routine X 2) w Reflex to ID Panel     Status: None (Preliminary result)   Collection Time:  03/22/17  3:09 PM  Result Value Ref Range Status   Specimen Description LEFT ANTECUBITAL  Final   Special Requests   Final    BOTTLES DRAWN AEROBIC AND ANAEROBIC Blood Culture adequate volume   Culture NO GROWTH 2 DAYS  Final   Report Status PENDING  Incomplete     Labs: Basic Metabolic Panel:  Recent Labs Lab 03/22/17 1509 03/23/17 0615 03/24/17 0522  NA 134* 140 144  K 3.9 3.4* 3.9  CL 97* 104 108  CO2 26 28 29   GLUCOSE 124* 126* 105*  BUN 29* 21* 19  CREATININE 1.87* 1.32* 1.08  CALCIUM 9.7 9.2 9.2  MG  --   --  1.7   Liver Function Tests: No results for input(s): AST, ALT, ALKPHOS, BILITOT, PROT, ALBUMIN in the last 168 hours. No results for input(s): LIPASE, AMYLASE in the last 168 hours. No results for input(s): AMMONIA in the last 168 hours. CBC:  Recent Labs Lab 03/22/17 1509 03/23/17 0615 03/24/17 0522  WBC 19.1* 12.3* 8.5  NEUTROABS 16.5*  --   --   HGB 13.1 11.8* 11.5*  HCT 37.6* 33.4* 34.0*  MCV 93.8 93.6 95.5  PLT 227 215 226   Cardiac Enzymes: No results for input(s): CKTOTAL, CKMB, CKMBINDEX, TROPONINI in the last 168 hours. BNP: Invalid input(s): POCBNP CBG: No results for input(s): GLUCAP in the last 168 hours.  Time coordinating discharge:  Greater than 30 minutes  Signed:  Cabrina Shiroma, DO [facility] Pager: [phone] 03/24/2017, 11:22 AM

## 2017-03-27 LAB — CULTURE, BLOOD (ROUTINE X 2)
CULTURE: NO GROWTH
CULTURE: NO GROWTH
SPECIAL REQUESTS: ADEQUATE
Special Requests: ADEQUATE

## 2017-07-23 ENCOUNTER — Ambulatory Visit (HOSPITAL_COMMUNITY)
Admission: RE | Admit: 2017-07-23 | Discharge: 2017-07-23 | Disposition: A | Discharge status: Home / Self Care | Payer: Medicare Other | Source: Ambulatory Visit | Attending: Internal Medicine | Admitting: Internal Medicine

## 2017-07-23 ENCOUNTER — Other Ambulatory Visit (HOSPITAL_COMMUNITY): Payer: Self-pay | Admitting: Internal Medicine

## 2017-07-23 DIAGNOSIS — R600 Localized edema: Secondary | ICD-10-CM | POA: Diagnosis present

## 2017-08-11 ENCOUNTER — Inpatient Hospital Stay (HOSPITAL_COMMUNITY)
Admission: EM | Admit: 2017-08-11 | Discharge: 2017-08-13 | DRG: 872 | Disposition: A | Discharge status: Home / Self Care | Payer: Medicare Other | Attending: Family Medicine | Admitting: Family Medicine

## 2017-08-11 ENCOUNTER — Emergency Department (HOSPITAL_COMMUNITY): Payer: Medicare Other

## 2017-08-11 ENCOUNTER — Other Ambulatory Visit: Payer: Self-pay

## 2017-08-11 ENCOUNTER — Encounter (HOSPITAL_COMMUNITY): Payer: Self-pay

## 2017-08-11 DIAGNOSIS — T451X5A Adverse effect of antineoplastic and immunosuppressive drugs, initial encounter: Secondary | ICD-10-CM | POA: Diagnosis present

## 2017-08-11 DIAGNOSIS — N179 Acute kidney failure, unspecified: Secondary | ICD-10-CM | POA: Diagnosis present

## 2017-08-11 DIAGNOSIS — C61 Malignant neoplasm of prostate: Secondary | ICD-10-CM | POA: Diagnosis present

## 2017-08-11 DIAGNOSIS — A419 Sepsis, unspecified organism: Secondary | ICD-10-CM | POA: Diagnosis not present

## 2017-08-11 DIAGNOSIS — N390 Urinary tract infection, site not specified: Secondary | ICD-10-CM | POA: Diagnosis present

## 2017-08-11 DIAGNOSIS — G620 Drug-induced polyneuropathy: Secondary | ICD-10-CM | POA: Diagnosis present

## 2017-08-11 DIAGNOSIS — R509 Fever, unspecified: Secondary | ICD-10-CM

## 2017-08-11 DIAGNOSIS — Z79899 Other long term (current) drug therapy: Secondary | ICD-10-CM | POA: Diagnosis not present

## 2017-08-11 DIAGNOSIS — D72829 Elevated white blood cell count, unspecified: Secondary | ICD-10-CM

## 2017-08-11 DIAGNOSIS — R6510 Systemic inflammatory response syndrome (SIRS) of non-infectious origin without acute organ dysfunction: Secondary | ICD-10-CM | POA: Diagnosis present

## 2017-08-11 DIAGNOSIS — N10 Acute tubulo-interstitial nephritis: Secondary | ICD-10-CM | POA: Diagnosis not present

## 2017-08-11 DIAGNOSIS — E7800 Pure hypercholesterolemia, unspecified: Secondary | ICD-10-CM | POA: Diagnosis present

## 2017-08-11 LAB — CBC WITH DIFFERENTIAL/PLATELET
BASOS PCT: 0 %
Basophils Absolute: 0 10*3/uL (ref 0.0–0.1)
EOS ABS: 0 10*3/uL (ref 0.0–0.7)
Eosinophils Relative: 0 %
HEMATOCRIT: 33.5 % — AB (ref 39.0–52.0)
Hemoglobin: 10.9 g/dL — ABNORMAL LOW (ref 13.0–17.0)
LYMPHS ABS: 0.9 10*3/uL (ref 0.7–4.0)
LYMPHS PCT: 4 %
MCH: 32.5 pg (ref 26.0–34.0)
MCHC: 32.5 g/dL (ref 30.0–36.0)
MCV: 100 fL (ref 78.0–100.0)
MONOS PCT: 10 %
Monocytes Absolute: 2.3 10*3/uL — ABNORMAL HIGH (ref 0.1–1.0)
NEUTROS ABS: 19.7 10*3/uL — AB (ref 1.7–7.7)
Neutrophils Relative %: 86 %
Platelets: 228 10*3/uL (ref 150–400)
RBC: 3.35 MIL/uL — ABNORMAL LOW (ref 4.22–5.81)
RDW: 16.8 % — AB (ref 11.5–15.5)
WBC: 22.9 10*3/uL — ABNORMAL HIGH (ref 4.0–10.5)

## 2017-08-11 LAB — URINALYSIS, ROUTINE W REFLEX MICROSCOPIC
Bilirubin Urine: NEGATIVE
Glucose, UA: NEGATIVE mg/dL
Ketones, ur: NEGATIVE mg/dL
Nitrite: POSITIVE — AB
Protein, ur: NEGATIVE mg/dL
SPECIFIC GRAVITY, URINE: 1.011 (ref 1.005–1.030)
pH: 5 (ref 5.0–8.0)

## 2017-08-11 LAB — COMPREHENSIVE METABOLIC PANEL
ALBUMIN: 3.6 g/dL (ref 3.5–5.0)
ALT: 15 U/L — AB (ref 17–63)
AST: 28 U/L (ref 15–41)
Alkaline Phosphatase: 54 U/L (ref 38–126)
Anion gap: 10 (ref 5–15)
BILIRUBIN TOTAL: 1.1 mg/dL (ref 0.3–1.2)
BUN: 21 mg/dL — AB (ref 6–20)
CO2: 25 mmol/L (ref 22–32)
CREATININE: 1.16 mg/dL (ref 0.61–1.24)
Calcium: 8.9 mg/dL (ref 8.9–10.3)
Chloride: 101 mmol/L (ref 101–111)
GFR calc Af Amer: >60 mL/min (ref >60)
GFR calc non Af Amer: 59 mL/min — ABNORMAL LOW (ref >60)
GLUCOSE: 153 mg/dL — AB (ref 65–99)
POTASSIUM: 3.9 mmol/L (ref 3.5–5.1)
SODIUM: 136 mmol/L (ref 135–145)
TOTAL PROTEIN: 6.5 g/dL (ref 6.5–8.1)

## 2017-08-11 LAB — I-STAT CG4 LACTIC ACID, ED: Lactic Acid, Venous: 1.91 mmol/L — ABNORMAL HIGH (ref 0.5–1.9)

## 2017-08-11 LAB — PROTIME-INR
INR: 1.23
Prothrombin Time: 15.4 seconds — ABNORMAL HIGH (ref 11.4–15.2)

## 2017-08-11 LAB — TROPONIN I: Troponin I: <0.03 ng/mL (ref <0.03)

## 2017-08-11 MED ORDER — ONDANSETRON HCL 4 MG PO TABS: 4.0000 mg | ORAL_TABLET | Freq: Four times a day (QID) | ORAL | Status: DC | PRN

## 2017-08-11 MED ORDER — SODIUM CHLORIDE 0.9 % IV SOLN
1000.0000 mL | INTRAVENOUS | Status: DC
  Administered 2017-08-11 – 2017-08-13 (×6): 1000 mL via INTRAVENOUS

## 2017-08-11 MED ORDER — ACETAMINOPHEN 325 MG PO TABS
650.0000 mg | ORAL_TABLET | Freq: Four times a day (QID) | ORAL | Status: DC | PRN
  Administered 2017-08-11 – 2017-08-12 (×3): 650 mg via ORAL
  Filled 2017-08-11 (×3): qty 2

## 2017-08-11 MED ORDER — POTASSIUM CHLORIDE CRYS ER 20 MEQ PO TBCR: 20.0000 meq | EXTENDED_RELEASE_TABLET | Freq: Every day | ORAL | Status: DC

## 2017-08-11 MED ORDER — ACETAMINOPHEN 650 MG RE SUPP
650.0000 mg | Freq: Four times a day (QID) | RECTAL | Status: DC | PRN
  Filled 2017-08-11: qty 1

## 2017-08-11 MED ORDER — ACETAMINOPHEN 325 MG PO TABS
650.0000 mg | ORAL_TABLET | Freq: Once | ORAL | Status: AC
  Administered 2017-08-11: 650 mg via ORAL
  Filled 2017-08-11: qty 2

## 2017-08-11 MED ORDER — VANCOMYCIN HCL IN DEXTROSE 1-5 GM/200ML-% IV SOLN
1000.0000 mg | Freq: Once | INTRAVENOUS | Status: AC
  Administered 2017-08-11: 1000 mg via INTRAVENOUS
  Filled 2017-08-11: qty 200

## 2017-08-11 MED ORDER — PIPERACILLIN-TAZOBACTAM 3.375 G IVPB
3.3750 g | Freq: Three times a day (TID) | INTRAVENOUS | Status: DC
  Administered 2017-08-12 – 2017-08-13 (×5): 3.375 g via INTRAVENOUS
  Filled 2017-08-11 (×5): qty 50

## 2017-08-11 MED ORDER — VANCOMYCIN HCL IN DEXTROSE 1-5 GM/200ML-% IV SOLN
1000.0000 mg | Freq: Two times a day (BID) | INTRAVENOUS | Status: DC
  Administered 2017-08-11 – 2017-08-12 (×2): 1000 mg via INTRAVENOUS
  Filled 2017-08-11 (×2): qty 200

## 2017-08-11 MED ORDER — POTASSIUM CHLORIDE CRYS ER 20 MEQ PO TBCR
20.0000 meq | EXTENDED_RELEASE_TABLET | Freq: Every day | ORAL | Status: DC
  Administered 2017-08-12 – 2017-08-13 (×2): 20 meq via ORAL
  Filled 2017-08-11 (×2): qty 1

## 2017-08-11 MED ORDER — ONDANSETRON HCL 4 MG/2ML IJ SOLN: 4.0000 mg | Freq: Four times a day (QID) | INTRAMUSCULAR | Status: DC | PRN

## 2017-08-11 MED ORDER — FUROSEMIDE 20 MG PO TABS
20.0000 mg | ORAL_TABLET | Freq: Two times a day (BID) | ORAL | Status: DC
  Administered 2017-08-11 – 2017-08-13 (×4): 20 mg via ORAL
  Filled 2017-08-11 (×4): qty 1

## 2017-08-11 MED ORDER — ACETAMINOPHEN 325 MG PO TABS: 650.0000 mg | ORAL_TABLET | Freq: Four times a day (QID) | ORAL | Status: DC | PRN

## 2017-08-11 MED ORDER — SODIUM CHLORIDE 0.9 % IV SOLN
INTRAVENOUS | Status: DC
  Filled 2017-08-11 (×9): qty 1000

## 2017-08-11 MED ORDER — TAMSULOSIN HCL 0.4 MG PO CAPS
0.4000 mg | ORAL_CAPSULE | Freq: Two times a day (BID) | ORAL | Status: DC
  Administered 2017-08-11 – 2017-08-13 (×4): 0.4 mg via ORAL
  Filled 2017-08-11 (×4): qty 1

## 2017-08-11 MED ORDER — PIPERACILLIN-TAZOBACTAM 3.375 G IVPB 30 MIN
3.3750 g | Freq: Once | INTRAVENOUS | Status: AC
  Administered 2017-08-11: 3.375 g via INTRAVENOUS
  Filled 2017-08-11: qty 50

## 2017-08-11 MED ORDER — ACETAMINOPHEN 325 MG RE SUPP
325.0000 mg | Freq: Once | RECTAL | Status: AC
  Administered 2017-08-11: 325 mg via RECTAL

## 2017-08-11 NOTE — Progress Notes (Signed)
PT reports chills. Temp 100.4 Orally. Warm blanket applied. Temp in room increased. Continue to monitor.

## 2017-08-11 NOTE — ED Triage Notes (Signed)
Patient reports of fever, shortness of breath and swelling to bilateral legs yesterday. Patient self cath's himself.  Denies urinary symptoms. Has prostate cancer. Last radiation 3 weeks ago.

## 2017-08-11 NOTE — Progress Notes (Signed)
41F Foley catheter inserted due to retention. PT a chronic self cath at home. PT tolerated foley insertion well. Used sterile technique. Secured with leg anchor. 126mL out, cloudy and yellow. Continue to monitor.

## 2017-08-11 NOTE — Progress Notes (Signed)
Pharmacy Antibiotic Note  Brian Love is a 76 y.o. male admitted on 08/11/2017 with sepsis.  Pharmacy has been consulted for Bylas dosing.  Plan:  Vancomycin 1000mg  IV q12h Check trough at steady state Zosyn 3.375gm IV q8h, EID Monitor labs, renal fxn, progress and c/s Deescalate ABX when improved / appropriate.    Height: 5\' 10"  (177.8 cm) Weight: 194 lb (88 kg) IBW/kg (Calculated) : 73  Temp (24hrs), Avg:102.6 F (39.2 C), Min:101.3 F (38.5 C), Max:103.1 F (39.5 C)  Recent Labs  Lab 08/11/17 1512 08/11/17 1610  WBC 22.9*  --   CREATININE 1.16  --   LATICACIDVEN  --  1.91*    Estimated Creatinine Clearance: 60.5 mL/min (by C-G formula based on SCr of 1.16 mg/dL).    No Known Allergies  Antimicrobials this admission: Vancomycin 11/25 >>  Zosyn 11/25 >>   Dose adjustments this admission:  Microbiology results:  BCx: pending  UCx: pending   Sputum:    MRSA PCR:   Thank you for allowing pharmacy to be a part of this patient's care.  Hart Robinsons A 08/11/2017 8:04 PM

## 2017-08-11 NOTE — H&P (Signed)
History and Physical    Brian Love QMV:[id number] DOB: [birth year] DOA: 08/11/2017  PCP: Glenda Chroman, MD (Confirm with patient/family/NH records and if not entered, this has to be entered at [facility] point of entry) Patient coming from: home    I have personally briefly reviewed patient's old medical records in [place]  Chief Complaint: Fevers and chills  HPI: Brian Love is a 76 y.o. male with medical history significant of prostate CA on active chemo (last was about 2 weeks ago, and received Neulasta at that time) now with fevers, chills, feeling terrible.  Endorses bilateral leg discomfort/fullness for weeks since on chemo, also some base of foot pain.    Denies: dysuria, ha, rash, bowel/bladder dysfunction.   ED Course: Given IVF, Vanc, zosyn, asked for admit. CTs noted.  Review of Systems: As per HPI otherwise 10 point review of systems negative.    Past Medical History:  Diagnosis Date  . High cholesterol   . Prostate cancer [facility])     Past Surgical History:  Procedure Laterality Date  . CATARACT EXTRACTION    . KIDNEY STONE SURGERY       reports that  has never smoked. he has never used smokeless tobacco. He reports that he drinks alcohol. He reports that he does not use drugs.  No Known Allergies  History reviewed. No pertinent family history.   Prior to Admission medications   Medication Sig Start Date End Date Taking? Authorizing Provider  Coenzyme Q10 (CO Q 10 PO) Take 1 capsule by mouth daily.   Yes [provider]  docetaxel (TAXOTERE) 20 MG/0.5ML injection Inject into the vein every 21 ( twenty-one) days.   Yes [provider]  furosemide (LASIX) 20 MG tablet Take 20 mg by mouth 2 (two) times daily.   Yes [provider]  leuprolide, 6 Month, (ELIGARD) 45 MG injection Inject 45 mg into the skin every 6 (six) months. 05/15/17  Yes [provider]  ondansetron (ZOFRAN) 8 MG tablet Take 8 mg by mouth every 8  (eight) hours as needed for nausea or vomiting.  05/23/17  Yes [provider]  potassium chloride SA (K-DUR,KLOR-CON) 20 MEQ tablet Take 20 mEq by mouth daily. 07/25/17 08/24/17 Yes [provider]  simvastatin (ZOCOR) 40 MG tablet Take 40 mg by mouth at bedtime.   Yes [provider]  tamsulosin (FLOMAX) 0.4 MG CAPS capsule Take 0.4 mg by mouth 2 (two) times daily.    Yes [provider]    Physical Exam: Vitals:   08/11/17 1800 08/11/17 1806 08/11/17 1838 08/11/17 1857  BP: 139/89  120/89   Pulse: (!) 109  99   Resp: (!) 40  (!) 26   Temp:  (!) 103 F (39.4 C) (!) 103 F (39.4 C) (!) 103.1 F (39.5 C)  TempSrc:  Oral Oral Oral  SpO2: 95%  95%   Weight:      Height:        Constitutional: NAD, calm, comfortable Vitals:   08/11/17 1800 08/11/17 1806 08/11/17 1838 08/11/17 1857  BP: 139/89  120/89   Pulse: (!) 109  99   Resp: (!) 40  (!) 26   Temp:  (!) 103 F (39.4 C) (!) 103 F (39.4 C) (!) 103.1 F (39.5 C)  TempSrc:  Oral Oral Oral  SpO2: 95%  95%   Weight:      Height:       Eyes: PERRL, lids and conjunctivae normal ENMT:  Mucous membranes are moist. Posterior pharynx clear of any exudate or lesions.Normal dentition.  Neck: normal, supple, no masses, no thyromegaly Respiratory: clear to auscultation bilaterally, no wheezing, no crackles. Normal respiratory effort. No accessory muscle use.  Cardiovascular: Regular rate and rhythm, no murmurs / rubs / gallops. No extremity edema. 2+ pedal pulses. No carotid bruits.  Abdomen: no tenderness, no masses palpated. No hepatosplenomegaly. Bowel sounds positive.  Musculoskeletal: no clubbing / cyanosis. No joint deformity upper and lower extremities. Good ROM, no contractures. Normal muscle tone.  Skin: no rashes, lesions, ulcers. No induration =there is mild edema bilateral lower ext, no homans sign, no rash/not red Neurologic: CN 2-12 grossly intact. Sensation intact, DTR normal. Strength 5/5  in all 4.  Psychiatric: Normal judgment and insight. Alert and oriented x 3. Normal mood.    Labs on Admission: I have personally reviewed following labs and imaging studies  CBC: Recent Labs  Lab 08/11/17 1512  WBC 22.9*  NEUTROABS 19.7*  HGB 10.9*  HCT 33.5*  MCV 100.0  PLT 948   Basic Metabolic Panel: Recent Labs  Lab 08/11/17 1512  NA 136  K 3.9  CL 101  CO2 25  GLUCOSE 153*  BUN 21*  CREATININE 1.16  CALCIUM 8.9   GFR: Estimated Creatinine Clearance: 60.5 mL/min (by C-G formula based on SCr of 1.16 mg/dL). Liver Function Tests: Recent Labs  Lab 08/11/17 1512  AST 28  ALT 15*  ALKPHOS 54  BILITOT 1.1  PROT 6.5  ALBUMIN 3.6   No results for input(s): LIPASE, AMYLASE in the last 168 hours. No results for input(s): AMMONIA in the last 168 hours. Coagulation Profile: Recent Labs  Lab 08/11/17 1512  INR 1.23   Cardiac Enzymes: Recent Labs  Lab 08/11/17 1512  TROPONINI <0.03   BNP (last 3 results) No results for input(s): PROBNP in the last 8760 hours. HbA1C: No results for input(s): HGBA1C in the last 72 hours. CBG: No results for input(s): GLUCAP in the last 168 hours. Lipid Profile: No results for input(s): CHOL, HDL, LDLCALC, TRIG, CHOLHDL, LDLDIRECT in the last 72 hours. Thyroid Function Tests: No results for input(s): TSH, T4TOTAL, FREET4, T3FREE, THYROIDAB in the last 72 hours. Anemia Panel: No results for input(s): VITAMINB12, FOLATE, FERRITIN, TIBC, IRON, RETICCTPCT in the last 72 hours. Urine analysis:    Component Value Date/Time   COLORURINE YELLOW 08/11/2017 1540   APPEARANCEUR HAZY (A) 08/11/2017 1540   LABSPEC 1.011 08/11/2017 1540   PHURINE 5.0 08/11/2017 1540   GLUCOSEU NEGATIVE 08/11/2017 1540   HGBUR SMALL (A) 08/11/2017 1540   BILIRUBINUR NEGATIVE 08/11/2017 1540   KETONESUR NEGATIVE 08/11/2017 1540   PROTEINUR NEGATIVE 08/11/2017 1540   NITRITE POSITIVE (A) 08/11/2017 1540   LEUKOCYTESUR SMALL (A) 08/11/2017 1540     Radiological Exams on Admission: Dg Chest Port 1 View  Result Date: 08/11/2017 CLINICAL DATA:  Fever EXAM: PORTABLE CHEST 1 VIEW COMPARISON:  09/06/2011 FINDINGS: The heart size and mediastinal contours are within normal limits. Both lungs are clear. The visualized skeletal structures are unremarkable. IMPRESSION: No active disease. Electronically Signed   By: Ashley Royalty M.D.   On: 08/11/2017 16:59     Assessment/Plan Active Problems:   SIRS (systemic inflammatory response syndrome) (HCC)  -rec vanc/zosyn for now  -await cxs  -sx care at this time  -likely source urinary with bacteremia  2. Sepsis  -as above  3. Prostate CA  -no changes to meds  -next chemo scheduled on Thu, will likely miss  4. LE Edema  -rec ECHO (none recently)   -may be side effect of chemo (this is what Heme onc said to him)  -lasix as directed  -consider TED hose if tolerated  -no overt evidence of DVT  DVT prophylaxis: ordered Code Status: full Family Communication: wife present Disposition Plan: home Consults called: none Admission status: inpt   Serita Grammes MD [facility]   If 7PM-7AM, please contact night-coverage [email or web address] Password [email or web address]  08/11/2017, 7:01 PM

## 2017-08-11 NOTE — ED Provider Notes (Signed)
[facility] EMERGENCY DEPARTMENT Provider Note   CSN: [id number] Arrival date & time: 08/11/17  1415     History   Chief Complaint Chief Complaint  Patient presents with  . Fever    HPI Brian Love is a 76 y.o. male.   Fever    Pt was seen at 1500. Per pt, c/o gradual onset and persistence of constant fevers to "102.8" since overnight last night. Pt states he self-caths nightly and is concerned that may be the source. He has noticed "dark" urine, but denies dysuria/hematuria. 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 Denies CP/palpitations, no SOB/cough, no abd pain, no N/V/D, no back pain, no rash, no testicular pain/swelling, no focal motor weakness.   Past Medical History:  Diagnosis Date  . High cholesterol   . Prostate cancer [facility])     Patient Active Problem List   Diagnosis Date Noted  . Sepsis due to undetermined organism ([place]) 03/23/2017  . Acute pyelonephritis 03/23/2017  . Hypokalemia 03/23/2017  . UTI (urinary tract infection) 03/22/2017  . Prostate cancer ([place]) 03/22/2017  . Acute kidney injury ([place]) 03/22/2017  . Fever 03/22/2017  . Acute urinary retention 03/22/2017  . High cholesterol     Past Surgical History:  Procedure Laterality Date  . CATARACT EXTRACTION    . KIDNEY STONE SURGERY         Home Medications    Prior to Admission medications   Medication Sig Start Date End Date Taking? Authorizing Provider  Coenzyme Q10 (CO Q 10 PO) Take 1 capsule by mouth daily.    [provider]  glucosamine-chondroitin 500-400 MG tablet Take 1 tablet by mouth 2 (two) times daily.     [provider]  HYDROcodone-acetaminophen (NORCO/VICODIN) 5-325 MG tablet Take 1 tablet by mouth every 6 (six) hours as needed for moderate pain.    [provider]  levofloxacin (LEVAQUIN) 500 MG tablet Take 500 mg by mouth daily. 10 day course starting on 03/21/2017    [provider]  simvastatin (ZOCOR) 40 MG tablet Take 40 mg by mouth at bedtime.    [provider]  tamsulosin (FLOMAX) 0.4 MG CAPS capsule Take 0.4 mg by mouth daily.    [provider]    Family History No family history on file.  Social History Social History   Tobacco Use  . Smoking status: Never Smoker  . Smokeless tobacco: Never Used  Substance Use Topics  . Alcohol use: Yes    Comment: occasionally  . Drug use: No     Allergies   Patient has no known allergies.   Review of Systems Review of Systems  Constitutional: Positive for fever.  ROS: Statement: All systems negative except as marked or noted in the HPI; Constitutional: +fever and chills. ; ; Eyes: Negative for eye pain, redness and discharge. ; ; ENMT: Negative for ear pain, hoarseness, nasal congestion, sinus pressure and sore throat. ; ; Cardiovascular: Negative for chest pain, palpitations, diaphoresis, dyspnea and +peripheral edema x3 weeks. ; ; Respiratory: Negative for cough, wheezing and stridor. ; ; Gastrointestinal: Negative for nausea, vomiting, diarrhea, abdominal pain, blood in stool, hematemesis, jaundice and rectal bleeding. . ; ; Genitourinary: Negative for dysuria, flank pain and hematuria. ; ; Musculoskeletal: Negative for back pain and neck pain. Negative  for swelling and trauma.; ; Skin: Negative for pruritus, rash, abrasions, blisters, bruising and skin lesion.; ; Neuro: Negative for headache, lightheadedness and neck stiffness. Negative for weakness, altered level of consciousness, altered mental status, extremity weakness, paresthesias, involuntary movement, seizure and syncope.      Physical Exam Updated Vital Signs BP 115/75   Pulse 95   Temp (!) 102.8 F (39.3 C) (Oral)   Resp 18   Ht 5\' 10"  (1.778 m)   Wt 88 kg (194 lb)   SpO2 94%   BMI  27.84 kg/m    Patient Vitals for the past 24 hrs:  BP Temp Temp src Pulse Resp SpO2 Height Weight  08/11/17 1637 114/63 (!) 101.3 F (38.5 C) Oral 92 (!) 23 93 % - -  08/11/17 1500 115/75 - - 95 18 94 % - -  08/11/17 1433 139/77 (!) 102.8 F (39.3 C) Oral (!) 103 17 95 % 5\' 10"  (1.778 m) 88 kg (194 lb)  08/11/17 1432 - - - - - 95 % - -     Physical Exam 1505: Physical examination:  Nursing notes reviewed; Vital signs and O2 SAT reviewed; +febrile.;; Constitutional: Well developed, Well nourished, Well hydrated, In no acute distress; Head:  Normocephalic, atraumatic; Eyes: EOMI, PERRL, No scleral icterus; ENMT: Mouth and pharynx normal, Mucous membranes moist; Neck: Supple, Full range of motion, No lymphadenopathy; Cardiovascular: Tachycardic rate and rhythm, No gallop; Respiratory: Breath sounds clear & equal bilaterally, No wheezes.  Speaking full sentences with ease, Normal respiratory effort/excursion; Chest: Nontender, Movement normal; Abdomen: Soft, Nontender, Nondistended, Normal bowel sounds; Genitourinary: No CVA tenderness; Extremities: Pulses normal, No tenderness, +2 pedal edema bilat. No calf asymmetry.; Neuro: AA&Ox3, Major CN grossly intact.  Speech clear. No gross focal motor or sensory deficits in extremities.; Skin: Color normal, Warm, Dry.   ED Treatments / Results  Labs (all labs ordered are listed, but only abnormal results are displayed)   EKG  EKG Interpretation  Date/Time:  Sunday August 11 2017 16:02:35 EST Ventricular Rate:  92 PR Interval:    QRS Duration: 100 QT Interval:  367 QTC Calculation: 454 R Axis:   54 Text Interpretation:  Sinus rhythm Borderline T abnormalities, inferior leads No old tracing to compare Confirmed by Francine Graven [phone]) on 08/11/2017 4:14:42 PM       Radiology   Procedures Procedures (including critical care time)  Medications Ordered in ED Medications  0.9 %  sodium chloride infusion (1,000 mLs Intravenous New  Bag/Given 08/11/17 1529)  piperacillin-tazobactam (ZOSYN) IVPB 3.375 g (not administered)  vancomycin (VANCOCIN) IVPB 1000 mg/200 mL premix (not administered)  acetaminophen (TYLENOL) tablet 650 mg (650 mg Oral Given 08/11/17 1528)     Initial Impression / Assessment and Plan / ED Course  I have reviewed the triage vital signs and the nursing notes.  Pertinent labs & imaging results that were available during my care of the patient were reviewed by me and considered in my medical decision making (see chart for details).  MDM Reviewed: previous chart, nursing note and vitals Reviewed previous: labs and ECG Interpretation: labs, ECG and x-ray Total time providing critical care: 30-74 minutes. This excludes time spent performing separately reportable procedures and services. Consults: admitting MD   CRITICAL CARE Performed by: Alfonzo Feller Total critical care time: 35 minutes Critical care time was exclusive of separately billable procedures and treating other patients. Critical care was necessary to treat or prevent imminent or life-threatening deterioration. Critical care was time spent personally by  me on the following activities: development of treatment plan with patient and/or surrogate as well as nursing, discussions with consultants, evaluation of patient's response to treatment, examination of patient, obtaining history from patient or surrogate, ordering and performing treatments and interventions, ordering and review of laboratory studies, ordering and review of radiographic studies, pulse oximetry and re-evaluation of patient's condition.   Results for orders placed or performed during the hospital encounter of 08/11/17  Comprehensive metabolic panel  Result Value Ref Range   Sodium 136 135 - 145 mmol/L   Potassium 3.9 3.5 - 5.1 mmol/L   Chloride 101 101 - 111 mmol/L   CO2 25 22 - 32 mmol/L   Glucose, Bld 153 (H) 65 - 99 mg/dL   BUN 21 (H) 6 - 20 mg/dL    Creatinine, Ser 1.16 0.61 - 1.24 mg/dL   Calcium 8.9 8.9 - 10.3 mg/dL   Total Protein 6.5 6.5 - 8.1 g/dL   Albumin 3.6 3.5 - 5.0 g/dL   AST 28 15 - 41 U/L   ALT 15 (L) 17 - 63 U/L   Alkaline Phosphatase 54 38 - 126 U/L   Total Bilirubin 1.1 0.3 - 1.2 mg/dL   GFR calc non Af Amer 59 (L) >60 mL/min   GFR calc Af Amer >60 >60 mL/min   Anion gap 10 5 - 15  CBC with Differential  Result Value Ref Range   WBC 22.9 (H) 4.0 - 10.5 K/uL   RBC 3.35 (L) 4.22 - 5.81 MIL/uL   Hemoglobin 10.9 (L) 13.0 - 17.0 g/dL   HCT 33.5 (L) 39.0 - 52.0 %   MCV 100.0 78.0 - 100.0 fL   MCH 32.5 26.0 - 34.0 pg   MCHC 32.5 30.0 - 36.0 g/dL   RDW 16.8 (H) 11.5 - 15.5 %   Platelets 228 150 - 400 K/uL   Neutrophils Relative % 86 %   Lymphocytes Relative 4 %   Monocytes Relative 10 %   Eosinophils Relative 0 %   Basophils Relative 0 %   Neutro Abs 19.7 (H) 1.7 - 7.7 K/uL   Lymphs Abs 0.9 0.7 - 4.0 K/uL   Monocytes Absolute 2.3 (H) 0.1 - 1.0 K/uL   Eosinophils Absolute 0.0 0.0 - 0.7 K/uL   Basophils Absolute 0.0 0.0 - 0.1 K/uL  Protime-INR  Result Value Ref Range   Prothrombin Time 15.4 (H) 11.4 - 15.2 seconds   INR 1.23   Urinalysis, Routine w reflex microscopic  Result Value Ref Range   Color, Urine YELLOW YELLOW   APPearance HAZY (A) CLEAR   Specific Gravity, Urine 1.011 1.005 - 1.030   pH 5.0 5.0 - 8.0   Glucose, UA NEGATIVE NEGATIVE mg/dL   Hgb urine dipstick SMALL (A) NEGATIVE   Bilirubin Urine NEGATIVE NEGATIVE   Ketones, ur NEGATIVE NEGATIVE mg/dL   Protein, ur NEGATIVE NEGATIVE mg/dL   Nitrite POSITIVE (A) NEGATIVE   Leukocytes, UA SMALL (A) NEGATIVE   RBC / HPF 0-5 0 - 5 RBC/hpf   WBC, UA TOO NUMEROUS TO COUNT 0 - 5 WBC/hpf   Bacteria, UA RARE (A) NONE SEEN   Squamous Epithelial / LPF 0-5 (A) NONE SEEN   WBC Clumps PRESENT    Mucus PRESENT   Troponin I  Result Value Ref Range   Troponin I <0.03 <0.03 ng/mL  I-Stat CG4 Lactic Acid, ED  Result Value Ref Range   Lactic Acid, Venous  1.91 (H) 0.5 - 1.9 mmol/L   US Venous Img Lower  Bilateral Result Date: 07/23/2017 CLINICAL DATA:  Bilateral lower extremity pain and edema for the past 3 days. History of prostate cancer and varicose veins. Evaluate for DVT. EXAM: BILATERAL LOWER EXTREMITY VENOUS DOPPLER ULTRASOUND TECHNIQUE: Gray-scale sonography with graded compression, as well as color Doppler and duplex ultrasound were performed to evaluate the lower extremity deep venous systems from the level of the common femoral vein and including the common femoral, femoral, profunda femoral, popliteal and calf veins including the posterior tibial, peroneal and gastrocnemius veins when visible. The superficial great saphenous vein was also interrogated. Spectral Doppler was utilized to evaluate flow at rest and with distal augmentation maneuvers in the common femoral, femoral and popliteal veins. COMPARISON:  None. FINDINGS: RIGHT LOWER EXTREMITY Common Femoral Vein: No evidence of thrombus. Normal compressibility, respiratory phasicity and response to augmentation. Saphenofemoral Junction: No evidence of thrombus. Normal compressibility and flow on color Doppler imaging. Profunda Femoral Vein: No evidence of thrombus. Normal compressibility and flow on color Doppler imaging. Femoral Vein: No evidence of thrombus. Normal compressibility, respiratory phasicity and response to augmentation. Popliteal Vein: No evidence of thrombus. Normal compressibility, respiratory phasicity and response to augmentation. Calf Veins: No evidence of thrombus. Normal compressibility and flow on color Doppler imaging. Superficial Great Saphenous Vein: No evidence of thrombus. Normal compressibility. Venous Reflux:  None. Other Findings:  None. LEFT LOWER EXTREMITY Common Femoral Vein: No evidence of thrombus. Normal compressibility, respiratory phasicity and response to augmentation. Saphenofemoral Junction: No evidence of thrombus. Normal compressibility and flow on color  Doppler imaging. Profunda Femoral Vein: No evidence of thrombus. Normal compressibility and flow on color Doppler imaging. Femoral Vein: No evidence of thrombus. Normal compressibility, respiratory phasicity and response to augmentation. Popliteal Vein: No evidence of thrombus. Normal compressibility, respiratory phasicity and response to augmentation. Calf Veins: No evidence of thrombus. Normal compressibility and flow on color Doppler imaging. Superficial Great Saphenous Vein: No evidence of thrombus. Normal compressibility. Venous Reflux:  None. Other Findings:  None. IMPRESSION: No evidence of DVT within either lower extremity. Electronically Signed   By: Sandi Mariscal M.D.   On: 07/23/2017 14:07    Dg Chest Port 1 View Result Date: 08/11/2017 CLINICAL DATA:  Fever EXAM: PORTABLE CHEST 1 VIEW COMPARISON:  09/06/2011 FINDINGS: The heart size and mediastinal contours are within normal limits. Both lungs are clear. The visualized skeletal structures are unremarkable. IMPRESSION: No active disease. Electronically Signed   By: Ashley Royalty M.D.   On: 08/11/2017 16:59    1700:  Code Sepsis called: APAP given for fever, IV abx given after BC and UC obtained. BP stable. Dx and testing d/w pt and family.  Questions answered.  Verb understanding, agreeable to admit.   T/C to [facility] Dr. Erlene Quan, case discussed, including:  HPI, pertinent PM/SHx, VS/PE, dx testing, ED course and treatment:  Agreeable to admit.      Final Clinical Impressions(s) / ED Diagnoses   Final diagnoses:  None    ED Discharge Orders    None        Francine Graven, DO 08/14/17 1904

## 2017-08-12 ENCOUNTER — Inpatient Hospital Stay (HOSPITAL_COMMUNITY): Payer: Medicare Other

## 2017-08-12 DIAGNOSIS — N10 Acute tubulo-interstitial nephritis: Secondary | ICD-10-CM

## 2017-08-12 DIAGNOSIS — R509 Fever, unspecified: Secondary | ICD-10-CM

## 2017-08-12 DIAGNOSIS — C61 Malignant neoplasm of prostate: Secondary | ICD-10-CM

## 2017-08-12 DIAGNOSIS — G620 Drug-induced polyneuropathy: Secondary | ICD-10-CM

## 2017-08-12 DIAGNOSIS — T451X5A Adverse effect of antineoplastic and immunosuppressive drugs, initial encounter: Secondary | ICD-10-CM

## 2017-08-12 DIAGNOSIS — N179 Acute kidney failure, unspecified: Secondary | ICD-10-CM

## 2017-08-12 DIAGNOSIS — A419 Sepsis, unspecified organism: Principal | ICD-10-CM

## 2017-08-12 LAB — ECHOCARDIOGRAM COMPLETE
HEIGHTINCHES: 70 in
Weight: 3104 oz

## 2017-08-12 LAB — BASIC METABOLIC PANEL
Anion gap: 8 (ref 5–15)
BUN: 19 mg/dL (ref 6–20)
CALCIUM: 8.1 mg/dL — AB (ref 8.9–10.3)
CHLORIDE: 104 mmol/L (ref 101–111)
CO2: 26 mmol/L (ref 22–32)
CREATININE: 1.13 mg/dL (ref 0.61–1.24)
GFR calc non Af Amer: >60 mL/min (ref >60)
GLUCOSE: 130 mg/dL — AB (ref 65–99)
Potassium: 3.4 mmol/L — ABNORMAL LOW (ref 3.5–5.1)
Sodium: 138 mmol/L (ref 135–145)

## 2017-08-12 LAB — CBC WITH DIFFERENTIAL/PLATELET
BASOS ABS: 0.1 10*3/uL (ref 0.0–0.1)
Basophils Relative: 0 %
Eosinophils Absolute: 0 10*3/uL (ref 0.0–0.7)
Eosinophils Relative: 0 %
HEMATOCRIT: 30.2 % — AB (ref 39.0–52.0)
HEMOGLOBIN: 9.7 g/dL — AB (ref 13.0–17.0)
LYMPHS PCT: 7 %
Lymphs Abs: 1.3 10*3/uL (ref 0.7–4.0)
MCH: 32 pg (ref 26.0–34.0)
MCHC: 32.1 g/dL (ref 30.0–36.0)
MCV: 99.7 fL (ref 78.0–100.0)
MONO ABS: 2.1 10*3/uL — AB (ref 0.1–1.0)
Monocytes Relative: 10 %
NEUTROS ABS: 16.3 10*3/uL — AB (ref 1.7–7.7)
NEUTROS PCT: 83 %
Platelets: 193 10*3/uL (ref 150–400)
RBC: 3.03 MIL/uL — AB (ref 4.22–5.81)
RDW: 17.2 % — AB (ref 11.5–15.5)
WBC: 19.7 10*3/uL — ABNORMAL HIGH (ref 4.0–10.5)

## 2017-08-12 LAB — LACTIC ACID, PLASMA: LACTIC ACID, VENOUS: 0.7 mmol/L (ref 0.5–1.9)

## 2017-08-12 MED ORDER — ACETAMINOPHEN 325 MG PO TABS
650.0000 mg | ORAL_TABLET | Freq: Four times a day (QID) | ORAL | Status: DC
  Administered 2017-08-12 – 2017-08-13 (×5): 650 mg via ORAL
  Filled 2017-08-12 (×5): qty 2

## 2017-08-12 MED ORDER — ENOXAPARIN SODIUM 40 MG/0.4ML ~~LOC~~ SOLN
40.0000 mg | SUBCUTANEOUS | Status: DC
  Administered 2017-08-12: 40 mg via SUBCUTANEOUS
  Filled 2017-08-12: qty 0.4

## 2017-08-12 NOTE — Care Management Note (Signed)
Case Management Note  Patient Details  Name: Brian Love MRN: [id number] Date of Birth: 1941/05/25  Subjective/Objective:        Admitted from home, lives with wife. He is ind with ALD's. He has been receiving chemo for prostate Ca. He self caths daily and feels this is how he got this infection. He is ind with ALD's, he drives, he has no HH or DME needs pta. He says he has done radiation, seed implants and chemo was recommended as extra coverage. He says chemo has made him too sick and he does not plan to take the last two treatments. Pt communicates no needs or concerns about DC plan of home with self care.            Action/Plan: DC home with self care.   Expected Discharge Date:     08/13/2017             Expected Discharge Plan:  Home/Self Care  In-House Referral:  NA  Discharge planning Services  NA  Post Acute Care Choice:  NA Choice offered to:  NA  Status of Service:  Completed, signed off  Sherald Barge, RN 08/12/2017, 2:15 PM

## 2017-08-12 NOTE — Progress Notes (Signed)
PROGRESS NOTE    [name]  OIZ:[id number]  DOB: 11/16/1940  DOA: 08/11/2017 PCP: Glenda Chroman, MD   Brief Admission Hx: Brian Love is a 76 y.o. male with medical history significant of prostate CA on active chemo (last was about 2 weeks ago, and received Neulasta at that time) now with fevers, chills, feeling terrible.  Endorses bilateral leg discomfort/fullness for weeks since on chemo, also some base of foot pain.    MDM/Assessment & Plan:   1. Sepsis - secondary to UTI - continue IV antibiotics and supportive care.  2. Fever - secondary to UTI - treat supportively.   3. Prostate cancer - Pt will discuss with his oncologist if he will continue the chemotherapy.  4. LE edema - stable, following.   5. Severe chemo induced neuropathy - Pt says that he may not continue with chemo given the severity of these side effects.   6. Leukocytosis - Pt has received neulasta.    DVT prophylaxis: lovenox/SCD Code Status: full  Family Communication:  Disposition Plan: Home in 1-2 days  Subjective: Pt says he was feeling well until he spiked the fever this morning.  Complains of neuropathy pain.   Objective: Vitals:   08/11/17 2145 08/11/17 2220 08/12/17 0456 08/12/17 0700  BP:   (!) 109/52 (!) 98/58  Pulse:   88 79  Resp:   16 18  Temp: (!) 101.3 F (38.5 C) (!) 100.4 F (38 C) 100.3 F (37.9 C) (!) 100.4 F (38 C)  TempSrc: Oral Oral Oral Oral  SpO2:   97% 94%  Weight:      Height:        Intake/Output Summary (Last 24 hours) at 08/12/2017 1201 Last data filed at 08/12/2017 1050 Gross per 24 hour  Intake 2734.58 ml  Output 1950 ml  Net 784.58 ml   Filed Weights   08/11/17 1433  Weight: 88 kg (194 lb)   REVIEW OF SYSTEMS  As per history otherwise all reviewed and reported negative  Exam:  General exam: awake, alert, NAD. Cooperative.  Respiratory system:  No increased work of breathing. Cardiovascular system: S1 & S2 heard.  Gastrointestinal  system: Abdomen is nondistended, soft and nontender. Normal bowel sounds heard. Central nervous system: Alert and oriented. No focal neurological deficits. Extremities: no cyanosis or clubbing. SCDs in place.   Data Reviewed: Basic Metabolic Panel: Recent Labs  Lab 08/11/17 1512 08/12/17 0546  NA 136 138  K 3.9 3.4*  CL 101 104  CO2 25 26  GLUCOSE 153* 130*  BUN 21* 19  CREATININE 1.16 1.13  CALCIUM 8.9 8.1*   Liver Function Tests: Recent Labs  Lab 08/11/17 1512  AST 28  ALT 15*  ALKPHOS 54  BILITOT 1.1  PROT 6.5  ALBUMIN 3.6   No results for input(s): LIPASE, AMYLASE in the last 168 hours. No results for input(s): AMMONIA in the last 168 hours. CBC: Recent Labs  Lab 08/11/17 1512 08/12/17 0546  WBC 22.9* 19.7*  NEUTROABS 19.7* 16.3*  HGB 10.9* 9.7*  HCT 33.5* 30.2*  MCV 100.0 99.7  PLT 228 193   Cardiac Enzymes: Recent Labs  Lab 08/11/17 1512  TROPONINI <0.03   CBG (last 3)  No results for input(s): GLUCAP in the last 72 hours. Recent Results (from the past 240 hour(s))  Culture, blood (Routine x 2)     Status: None (Preliminary result)   Collection Time: 08/11/17  3:14 PM  Result Value Ref Range Status  Specimen Description BLOOD LEFT ARM  Final   Special Requests   Final    BOTTLES DRAWN AEROBIC AND ANAEROBIC Blood Culture adequate volume   Culture NO GROWTH < 24 HOURS  Final   Report Status PENDING  Incomplete  Culture, blood (Routine x 2)     Status: None (Preliminary result)   Collection Time: 08/11/17  3:15 PM  Result Value Ref Range Status   Specimen Description LEFT ANTECUBITAL  Final   Special Requests   Final    BOTTLES DRAWN AEROBIC AND ANAEROBIC Blood Culture adequate volume   Culture NO GROWTH < 24 HOURS  Final   Report Status PENDING  Incomplete     Studies: Dg Chest Port 1 View  Result Date: 08/11/2017 CLINICAL DATA:  Fever EXAM: PORTABLE CHEST 1 VIEW COMPARISON:  09/06/2011 FINDINGS: The heart size and mediastinal contours  are within normal limits. Both lungs are clear. The visualized skeletal structures are unremarkable. IMPRESSION: No active disease. Electronically Signed   By: Ashley Royalty M.D.   On: 08/11/2017 16:59     Scheduled Meds: . furosemide  20 mg Oral BID  . potassium chloride SA  20 mEq Oral Daily  . tamsulosin  0.4 mg Oral BID   Continuous Infusions: . sodium chloride 1,000 mL (08/11/17 2053)  . piperacillin-tazobactam 3.375 g (08/12/17 0936)  . sodium chloride 0.9 % 1,000 mL infusion      Principal Problem:   Sepsis ([place]) Active Problems:   UTI (urinary tract infection)   Prostate cancer (H[facility])   Acute kidney injury ([place])   Fever in adult   SIRS (systemic inflammatory response syndrome) (HCC)   Chemotherapy-induced neuropathy ([place])  Time spent:   Irwin Brakeman, MD, FAAFP [facility] Pager [phone]  If 7PM-7AM, please contact night-coverage [email or web address] Password [email or web address] 08/12/2017, 12:01 PM    LOS: 1 day

## 2017-08-12 NOTE — Progress Notes (Signed)
*  PRELIMINARY RESULTS* Echocardiogram 2D Echocardiogram has been performed.  Leavy Cella 08/12/2017, 4:02 PM

## 2017-08-13 DIAGNOSIS — N390 Urinary tract infection, site not specified: Secondary | ICD-10-CM

## 2017-08-13 LAB — CBC WITH DIFFERENTIAL/PLATELET
BASOS ABS: 0 10*3/uL (ref 0.0–0.1)
Basophils Relative: 0 %
EOS PCT: 1 %
Eosinophils Absolute: 0.1 10*3/uL (ref 0.0–0.7)
HCT: 28.1 % — ABNORMAL LOW (ref 39.0–52.0)
Hemoglobin: 8.9 g/dL — ABNORMAL LOW (ref 13.0–17.0)
LYMPHS PCT: 12 %
Lymphs Abs: 1.6 10*3/uL (ref 0.7–4.0)
MCH: 31.9 pg (ref 26.0–34.0)
MCHC: 31.7 g/dL (ref 30.0–36.0)
MCV: 100.7 fL — AB (ref 78.0–100.0)
Monocytes Absolute: 1.8 10*3/uL — ABNORMAL HIGH (ref 0.1–1.0)
Monocytes Relative: 14 %
Neutro Abs: 9.5 10*3/uL — ABNORMAL HIGH (ref 1.7–7.7)
Neutrophils Relative %: 73 %
Platelets: 199 10*3/uL (ref 150–400)
RBC: 2.79 MIL/uL — AB (ref 4.22–5.81)
RDW: 17.2 % — AB (ref 11.5–15.5)
WBC: 12.9 10*3/uL — AB (ref 4.0–10.5)

## 2017-08-13 LAB — BASIC METABOLIC PANEL
ANION GAP: 5 (ref 5–15)
BUN: 15 mg/dL (ref 6–20)
CO2: 26 mmol/L (ref 22–32)
Calcium: 8.1 mg/dL — ABNORMAL LOW (ref 8.9–10.3)
Chloride: 108 mmol/L (ref 101–111)
Creatinine, Ser: 0.94 mg/dL (ref 0.61–1.24)
GFR calc Af Amer: >60 mL/min (ref >60)
Glucose, Bld: 105 mg/dL — ABNORMAL HIGH (ref 65–99)
Potassium: 3.3 mmol/L — ABNORMAL LOW (ref 3.5–5.1)
SODIUM: 139 mmol/L (ref 135–145)

## 2017-08-13 MED ORDER — AMOXICILLIN-POT CLAVULANATE 875-125 MG PO TABS: 1.0000 | ORAL_TABLET | Freq: Two times a day (BID) | ORAL | 0 refills | Status: AC

## 2017-08-13 MED ORDER — POTASSIUM CHLORIDE CRYS ER 20 MEQ PO TBCR
40.0000 meq | EXTENDED_RELEASE_TABLET | Freq: Once | ORAL | Status: AC
  Administered 2017-08-13: 40 meq via ORAL
  Filled 2017-08-13: qty 2

## 2017-08-13 NOTE — Discharge Summary (Signed)
Physician Discharge Summary  JOSAIAH MUHAMMED TGG:[id number] DOB: 02/03/1941 DOA: 08/11/2017  PCP: Glenda Chroman, MD Oncologist: C. [facility]cell[facility]  Admit date: 08/11/2017 Discharge date: 08/13/2017  Admitted From: Home  Disposition: Home  Recommendations for Outpatient Follow-up:  1. Follow up with PCP in 1 weeks 2. Follow up with oncologist in 2 days  3. Please obtain BMP/CBC in one week 4. Please follow up on the following pending results: final urine and blood culture results and sensitivities.   Discharge Condition: STABLE CODE STATUS: FULL    Brief Hospitalization Summary: Please see all hospital notes, images, labs for full details of the hospitalization. HPI: JONTAY MASTON is a 76 y.o. male with medical history significant of prostate CA on active chemo (last was about 2 weeks ago, and received Neulasta at that time) now with fevers, chills, feeling terrible.  Endorses bilateral leg discomfort/fullness for weeks since on chemo, also some base of foot pain.    Denies: dysuria, ha, rash, bowel/bladder dysfunction.   ED Course: Given IVF, Vanc, zosyn, asked for admit. CTs noted.  Brief Admission Hx: Omarie Parcell Hildrethis a 76 y.o.malewith medical history significant ofprostate CA on active chemo (last was about 2 weeks ago, and received Neulasta at that time) now with fevers, chills, feeling terrible. Endorses bilateral leg discomfort/fullness for weeks since on chemo, also some base of foot pain.   MDM/Assessment & Plan:   1. Sepsis - RESOLVED NOW. secondary to UTI - Treated with IV zosyn and supportive care with good results.  Pt improved and wanted to go home. Discharge on oral augmentin x 7 more days.  2. Fever - secondary to UTI - treated supportively.   3. Prostate cancer - Pt will discuss with his oncologist if he will continue the chemotherapy.  Pt has appt in 2 days to see oncologist. .   4. Severe chemo induced neuropathy - Pt will follow up with his  oncologist to discuss.  Pt says that he may not continue with chemo given the severity of these side effects.   5. Leukocytosis - WBC trending down from 19.7 to 12.9.  Pt had received neulasta recently.   DVT prophylaxis: lovenox/SCD Code Status: full  Family Communication:  Disposition Plan: Home  Discharge Diagnoses:  Principal Problem:   Sepsis ([place]) Active Problems:   UTI (urinary tract infection)   Prostate cancer ([place])   Acute kidney injury ([place])   Fever in adult   SIRS (systemic inflammatory response syndrome) (HCC)   Chemotherapy-induced neuropathy ([place])  Discharge Instructions: Discharge Instructions    Call MD for:  difficulty breathing, headache or visual disturbances   Complete by:  As directed    Call MD for:  extreme fatigue   Complete by:  As directed    Call MD for:  hives   Complete by:  As directed    Call MD for:  persistant dizziness or light-headedness   Complete by:  As directed    Call MD for:  persistant nausea and vomiting   Complete by:  As directed    Call MD for:  severe uncontrolled pain   Complete by:  As directed    Diet - low sodium heart healthy   Complete by:  As directed    Increase activity slowly   Complete by:  As directed      Allergies as of 08/13/2017   No Known Allergies     Medication List    STOP taking these medications   docetaxel  20 MG/0.5ML injection Commonly known as:  TAXOTERE     TAKE these medications   amoxicillin-clavulanate 875-125 MG tablet Commonly known as:  AUGMENTIN Take 1 tablet by mouth 2 (two) times daily for 7 days. Start taking on:  08/14/2017   CO Q 10 PO Take 1 capsule by mouth daily.   furosemide 20 MG tablet Commonly known as:  LASIX Take 20 mg by mouth 2 (two) times daily.   leuprolide (6 Month) 45 MG injection Commonly known as:  ELIGARD Inject 45 mg into the skin every 6 (six) months.   ondansetron 8 MG tablet Commonly known as:  ZOFRAN Take 8 mg by mouth every 8 (eight) hours  as needed for nausea or vomiting.   potassium chloride SA 20 MEQ tablet Commonly known as:  K-DUR,KLOR-CON Take 20 mEq by mouth daily.   simvastatin 40 MG tablet Commonly known as:  ZOCOR Take 40 mg by mouth at bedtime.   tamsulosin 0.4 MG Caps capsule Commonly known as:  FLOMAX Take 0.4 mg by mouth 2 (two) times daily.      Follow-up Information    Vyas, Dhruv B, MD. Schedule an appointment as soon as possible for a visit in 2 week(s).   Specialty:  Internal Medicine Contact information: [place] [address] [phone]        Mariel Craft, MD. Schedule an appointment as soon as possible for a visit in 2 day(s).   Specialty:  Hematology and Oncology Why:  Hospital Follow Up  Contact information: [place] [address] [phone]          No Known Allergies Current Discharge Medication List    START taking these medications   Details  amoxicillin-clavulanate (AUGMENTIN) 875-125 MG tablet Take 1 tablet by mouth 2 (two) times daily for 7 days. Qty: 14 tablet, Refills: 0      CONTINUE these medications which have NOT CHANGED   Details  Coenzyme Q10 (CO Q 10 PO) Take 1 capsule by mouth daily.    furosemide (LASIX) 20 MG tablet Take 20 mg by mouth 2 (two) times daily.    leuprolide, 6 Month, (ELIGARD) 45 MG injection Inject 45 mg into the skin every 6 (six) months.    ondansetron (ZOFRAN) 8 MG tablet Take 8 mg by mouth every 8 (eight) hours as needed for nausea or vomiting.     potassium chloride SA (K-DUR,KLOR-CON) 20 MEQ tablet Take 20 mEq by mouth daily.    simvastatin (ZOCOR) 40 MG tablet Take 40 mg by mouth at bedtime.    tamsulosin (FLOMAX) 0.4 MG CAPS capsule Take 0.4 mg by mouth 2 (two) times daily.       STOP taking these medications     docetaxel (TAXOTERE) 20 MG/0.5ML injection         Procedures/Studies: US Venous Img Lower Bilateral  Result Date: 07/23/2017 CLINICAL DATA:  Bilateral lower  extremity pain and edema for the past 3 days. History of prostate cancer and varicose veins. Evaluate for DVT. EXAM: BILATERAL LOWER EXTREMITY VENOUS DOPPLER ULTRASOUND TECHNIQUE: Gray-scale sonography with graded compression, as well as color Doppler and duplex ultrasound were performed to evaluate the lower extremity deep venous systems from the level of the common femoral vein and including the common femoral, femoral, profunda femoral, popliteal and calf veins including the posterior tibial, peroneal and gastrocnemius veins when visible. The superficial great saphenous vein was also interrogated. Spectral Doppler was utilized to evaluate flow at rest  and with distal augmentation maneuvers in the common femoral, femoral and popliteal veins. COMPARISON:  None. FINDINGS: RIGHT LOWER EXTREMITY Common Femoral Vein: No evidence of thrombus. Normal compressibility, respiratory phasicity and response to augmentation. Saphenofemoral Junction: No evidence of thrombus. Normal compressibility and flow on color Doppler imaging. Profunda Femoral Vein: No evidence of thrombus. Normal compressibility and flow on color Doppler imaging. Femoral Vein: No evidence of thrombus. Normal compressibility, respiratory phasicity and response to augmentation. Popliteal Vein: No evidence of thrombus. Normal compressibility, respiratory phasicity and response to augmentation. Calf Veins: No evidence of thrombus. Normal compressibility and flow on color Doppler imaging. Superficial Great Saphenous Vein: No evidence of thrombus. Normal compressibility. Venous Reflux:  None. Other Findings:  None. LEFT LOWER EXTREMITY Common Femoral Vein: No evidence of thrombus. Normal compressibility, respiratory phasicity and response to augmentation. Saphenofemoral Junction: No evidence of thrombus. Normal compressibility and flow on color Doppler imaging. Profunda Femoral Vein: No evidence of thrombus. Normal compressibility and flow on color Doppler  imaging. Femoral Vein: No evidence of thrombus. Normal compressibility, respiratory phasicity and response to augmentation. Popliteal Vein: No evidence of thrombus. Normal compressibility, respiratory phasicity and response to augmentation. Calf Veins: No evidence of thrombus. Normal compressibility and flow on color Doppler imaging. Superficial Great Saphenous Vein: No evidence of thrombus. Normal compressibility. Venous Reflux:  None. Other Findings:  None. IMPRESSION: No evidence of DVT within either lower extremity. Electronically Signed   By: Sandi Mariscal M.D.   On: 07/23/2017 14:07   Dg Chest Port 1 View  Result Date: 08/11/2017 CLINICAL DATA:  Fever EXAM: PORTABLE CHEST 1 VIEW COMPARISON:  09/06/2011 FINDINGS: The heart size and mediastinal contours are within normal limits. Both lungs are clear. The visualized skeletal structures are unremarkable. IMPRESSION: No active disease. Electronically Signed   By: Ashley Royalty M.D.   On: 08/11/2017 16:59      Subjective: Pt says that he feels much better today and would like to go home.  His fever has resolved now.  He plans to see his oncologist in 2 days.   Discharge Exam: Vitals:   08/12/17 2127 08/13/17 0400  BP: (!) 106/58 111/62  Pulse: 80 80  Resp: 18 18  Temp: 99.6 F (37.6 C) 98.6 F (37 C)  SpO2: 96% 96%   Vitals:   08/12/17 1653 08/12/17 1845 08/12/17 2127 08/13/17 0400  BP:   (!) 106/58 111/62  Pulse:   80 80  Resp:   18 18  Temp:  100 F (37.8 C) 99.6 F (37.6 C) 98.6 F (37 C)  TempSrc:   Oral Oral  SpO2: 92%  96% 96%  Weight:      Height:       General: Pt is alert, awake, not in acute distress Cardiovascular: RRR, S1/S2 +, no rubs, no gallops Respiratory: CTA bilaterally, no wheezing, no rhonchi Abdominal: Soft, NT, ND, bowel sounds + Extremities: no edema, no cyanosis   The results of significant diagnostics from this hospitalization (including imaging, microbiology, ancillary and laboratory) are listed below  for reference.     Microbiology: Recent Results (from the past 240 hour(s))  Culture, blood (Routine x 2)     Status: None (Preliminary result)   Collection Time: 08/11/17  3:14 PM  Result Value Ref Range Status   Specimen Description BLOOD LEFT ARM  Final   Special Requests   Final    BOTTLES DRAWN AEROBIC AND ANAEROBIC Blood Culture adequate volume   Culture NO GROWTH 2 DAYS  Final  Report Status PENDING  Incomplete  Culture, blood (Routine x 2)     Status: None (Preliminary result)   Collection Time: 08/11/17  3:15 PM  Result Value Ref Range Status   Specimen Description LEFT ANTECUBITAL  Final   Special Requests   Final    BOTTLES DRAWN AEROBIC AND ANAEROBIC Blood Culture adequate volume   Culture NO GROWTH 2 DAYS  Final   Report Status PENDING  Incomplete  Urine culture     Status: Abnormal (Preliminary result)   Collection Time: 08/11/17  3:40 PM  Result Value Ref Range Status   Specimen Description URINE, CLEAN CATCH  Final   Special Requests NONE  Final   Culture >=100,000 COLONIES/mL GRAM NEGATIVE RODS (A)  Final   Report Status PENDING  Incomplete     Labs: BNP (last 3 results) No results for input(s): BNP in the last 8760 hours. Basic Metabolic Panel: Recent Labs  Lab 08/11/17 1512 08/12/17 0546 08/13/17 0447  NA 136 138 139  K 3.9 3.4* 3.3*  CL 101 104 108  CO2 25 26 26   GLUCOSE 153* 130* 105*  BUN 21* 19 15  CREATININE 1.16 1.13 0.94  CALCIUM 8.9 8.1* 8.1*   Liver Function Tests: Recent Labs  Lab 08/11/17 1512  AST 28  ALT 15*  ALKPHOS 54  BILITOT 1.1  PROT 6.5  ALBUMIN 3.6   No results for input(s): LIPASE, AMYLASE in the last 168 hours. No results for input(s): AMMONIA in the last 168 hours. CBC: Recent Labs  Lab 08/11/17 1512 08/12/17 0546 08/13/17 0447  WBC 22.9* 19.7* 12.9*  NEUTROABS 19.7* 16.3* 9.5*  HGB 10.9* 9.7* 8.9*  HCT 33.5* 30.2* 28.1*  MCV 100.0 99.7 100.7*  PLT 228 193 199   Cardiac Enzymes: Recent Labs  Lab  08/11/17 1512  TROPONINI <0.03   BNP: Invalid input(s): POCBNP CBG: No results for input(s): GLUCAP in the last 168 hours. D-Dimer No results for input(s): DDIMER in the last 72 hours. Hgb A1c No results for input(s): HGBA1C in the last 72 hours. Lipid Profile No results for input(s): CHOL, HDL, LDLCALC, TRIG, CHOLHDL, LDLDIRECT in the last 72 hours. Thyroid function studies No results for input(s): TSH, T4TOTAL, T3FREE, THYROIDAB in the last 72 hours.  Invalid input(s): FREET3 Anemia work up No results for input(s): VITAMINB12, FOLATE, FERRITIN, TIBC, IRON, RETICCTPCT in the last 72 hours. Urinalysis    Component Value Date/Time   COLORURINE YELLOW 08/11/2017 1540   APPEARANCEUR HAZY (A) 08/11/2017 1540   LABSPEC 1.011 08/11/2017 1540   PHURINE 5.0 08/11/2017 1540   GLUCOSEU NEGATIVE 08/11/2017 1540   HGBUR SMALL (A) 08/11/2017 1540   BILIRUBINUR NEGATIVE 08/11/2017 1540   KETONESUR NEGATIVE 08/11/2017 1540   PROTEINUR NEGATIVE 08/11/2017 1540   NITRITE POSITIVE (A) 08/11/2017 1540   LEUKOCYTESUR SMALL (A) 08/11/2017 1540   Sepsis Labs Invalid input(s): PROCALCITONIN,  WBC,  LACTICIDVEN Microbiology Recent Results (from the past 240 hour(s))  Culture, blood (Routine x 2)     Status: None (Preliminary result)   Collection Time: 08/11/17  3:14 PM  Result Value Ref Range Status   Specimen Description BLOOD LEFT ARM  Final   Special Requests   Final    BOTTLES DRAWN AEROBIC AND ANAEROBIC Blood Culture adequate volume   Culture NO GROWTH 2 DAYS  Final   Report Status PENDING  Incomplete  Culture, blood (Routine x 2)     Status: None (Preliminary result)   Collection Time: 08/11/17  3:15 PM  Result Value Ref Range Status   Specimen Description LEFT ANTECUBITAL  Final   Special Requests   Final    BOTTLES DRAWN AEROBIC AND ANAEROBIC Blood Culture adequate volume   Culture NO GROWTH 2 DAYS  Final   Report Status PENDING  Incomplete  Urine culture     Status: Abnormal  (Preliminary result)   Collection Time: 08/11/17  3:40 PM  Result Value Ref Range Status   Specimen Description URINE, CLEAN CATCH  Final   Special Requests NONE  Final   Culture >=100,000 COLONIES/mL GRAM NEGATIVE RODS (A)  Final   Report Status PENDING  Incomplete   Time coordinating discharge: 34 mins  SIGNED:  Irwin Brakeman, MD  [facility] 08/13/2017, 1:03 PM Pager [phone]  If 7PM-7AM, please contact night-coverage [email or web address] Password [email or web address]

## 2017-08-13 NOTE — Discharge Instructions (Signed)
Follow with Primary MD  Vyas, Dhruv B, MD  and other consultant's as instructed your Hospitalist MD ° °Please get a complete blood count and chemistry panel checked by your Primary MD at your next visit, and again as instructed by your Primary MD. ° °Get Medicines reviewed and adjusted: °Please take all your medications with you for your next visit with your Primary MD ° °Laboratory/radiological data: °Please request your Primary MD to go over all hospital tests and procedure/radiological results at the follow up, please ask your Primary MD to get all Hospital records sent to his/her office. ° °In some cases, they will be blood work, cultures and biopsy results pending at the time of your discharge. Please request that your primary care M.D. follows up on these results. ° °Also Note the following: °If you experience worsening of your admission symptoms, develop shortness of breath, life threatening emergency, suicidal or homicidal thoughts you must seek medical attention immediately by calling 911 or calling your MD immediately  if symptoms less severe. ° °You must read complete instructions/literature along with all the possible adverse reactions/side effects for all the Medicines you take and that have been prescribed to you. Take any new Medicines after you have completely understood and accpet all the possible adverse reactions/side effects.  ° °Do not drive when taking Pain medications or sleeping medications (Benzodaizepines) ° °Do not take more than prescribed Pain, Sleep and Anxiety Medications. It is not advisable to combine anxiety,sleep and pain medications without talking with your primary care practitioner ° °Special Instructions: If you have smoked or chewed Tobacco  in the last 2 yrs please stop smoking, stop any regular Alcohol  and or any Recreational drug use. ° °Wear Seat belts while driving. ° °Please note: °You were cared for by a hospitalist during your hospital stay. Once you are discharged,  your primary care physician will handle any further medical issues. Please note that NO REFILLS for any discharge medications will be authorized once you are discharged, as it is imperative that you return to your primary care physician (or establish a relationship with a primary care physician if you do not have one) for your post hospital discharge needs so that they can reassess your need for medications and monitor your lab values. ° ° ° ° °

## 2017-08-13 NOTE — Care Management Important Message (Signed)
Important Message  Patient Details  Name: VUE PAVON MRN: [id number] Date of Birth: 04/25/1941   Medicare Important Message Given:  Yes    Sherald Barge, RN 08/13/2017, 1:19 PM

## 2017-08-13 NOTE — Progress Notes (Signed)
Patient discharged home.  IV removed - WNL.  Foley left in place - OK'd by MD, patient knowledgeable on foley care and has managed one at home before.  Reviewed AVS with patient and wife.  Stressed importance of taking complete dose of Abx to prevent further infection.  Instructed to follow up with Oncology and PCP.  Verbalizes understanding.  No questions at this time, in NAD.

## 2017-08-14 LAB — URINE CULTURE: Culture: >=100000 — AB

## 2017-08-16 LAB — CULTURE, BLOOD (ROUTINE X 2)
CULTURE: NO GROWTH
Culture: NO GROWTH
Special Requests: ADEQUATE
Special Requests: ADEQUATE

## 2017-10-01 ENCOUNTER — Other Ambulatory Visit: Payer: Self-pay

## 2017-10-01 ENCOUNTER — Emergency Department (HOSPITAL_COMMUNITY): Payer: Medicare Other

## 2017-10-01 ENCOUNTER — Encounter (HOSPITAL_COMMUNITY): Payer: Self-pay | Admitting: Emergency Medicine

## 2017-10-01 ENCOUNTER — Inpatient Hospital Stay (HOSPITAL_COMMUNITY)
Admission: EM | Admit: 2017-10-01 | Discharge: 2017-10-04 | DRG: 872 | Disposition: A | Discharge status: Other Acute Inpatient Hospital | Payer: Medicare Other | Attending: Family Medicine | Admitting: Family Medicine

## 2017-10-01 DIAGNOSIS — G620 Drug-induced polyneuropathy: Secondary | ICD-10-CM | POA: Diagnosis present

## 2017-10-01 DIAGNOSIS — Z79899 Other long term (current) drug therapy: Secondary | ICD-10-CM | POA: Diagnosis not present

## 2017-10-01 DIAGNOSIS — R339 Retention of urine, unspecified: Secondary | ICD-10-CM | POA: Diagnosis not present

## 2017-10-01 DIAGNOSIS — N1330 Unspecified hydronephrosis: Secondary | ICD-10-CM

## 2017-10-01 DIAGNOSIS — N12 Tubulo-interstitial nephritis, not specified as acute or chronic: Secondary | ICD-10-CM

## 2017-10-01 DIAGNOSIS — Z9221 Personal history of antineoplastic chemotherapy: Secondary | ICD-10-CM | POA: Diagnosis not present

## 2017-10-01 DIAGNOSIS — N1339 Other hydronephrosis: Secondary | ICD-10-CM

## 2017-10-01 DIAGNOSIS — N136 Pyonephrosis: Secondary | ICD-10-CM | POA: Diagnosis present

## 2017-10-01 DIAGNOSIS — N200 Calculus of kidney: Secondary | ICD-10-CM | POA: Diagnosis present

## 2017-10-01 DIAGNOSIS — R338 Other retention of urine: Secondary | ICD-10-CM | POA: Diagnosis present

## 2017-10-01 DIAGNOSIS — C61 Malignant neoplasm of prostate: Secondary | ICD-10-CM

## 2017-10-01 DIAGNOSIS — Z923 Personal history of irradiation: Secondary | ICD-10-CM

## 2017-10-01 DIAGNOSIS — N289 Disorder of kidney and ureter, unspecified: Secondary | ICD-10-CM | POA: Diagnosis present

## 2017-10-01 DIAGNOSIS — T451X5A Adverse effect of antineoplastic and immunosuppressive drugs, initial encounter: Secondary | ICD-10-CM | POA: Diagnosis present

## 2017-10-01 DIAGNOSIS — N132 Hydronephrosis with renal and ureteral calculous obstruction: Secondary | ICD-10-CM | POA: Diagnosis not present

## 2017-10-01 DIAGNOSIS — A419 Sepsis, unspecified organism: Secondary | ICD-10-CM | POA: Diagnosis not present

## 2017-10-01 DIAGNOSIS — N401 Enlarged prostate with lower urinary tract symptoms: Secondary | ICD-10-CM | POA: Diagnosis present

## 2017-10-01 DIAGNOSIS — K529 Noninfective gastroenteritis and colitis, unspecified: Secondary | ICD-10-CM | POA: Diagnosis present

## 2017-10-01 DIAGNOSIS — E7800 Pure hypercholesterolemia, unspecified: Secondary | ICD-10-CM | POA: Diagnosis present

## 2017-10-01 DIAGNOSIS — N390 Urinary tract infection, site not specified: Secondary | ICD-10-CM

## 2017-10-01 LAB — BASIC METABOLIC PANEL
Anion gap: 12 (ref 5–15)
BUN: 18 mg/dL (ref 6–20)
CO2: 24 mmol/L (ref 22–32)
CREATININE: 1.1 mg/dL (ref 0.61–1.24)
Calcium: 9.2 mg/dL (ref 8.9–10.3)
Chloride: 96 mmol/L — ABNORMAL LOW (ref 101–111)
Glucose, Bld: 126 mg/dL — ABNORMAL HIGH (ref 65–99)
Potassium: 3.6 mmol/L (ref 3.5–5.1)
SODIUM: 132 mmol/L — AB (ref 135–145)

## 2017-10-01 LAB — URINALYSIS, ROUTINE W REFLEX MICROSCOPIC
Bacteria, UA: NONE SEEN
Bilirubin Urine: NEGATIVE
Glucose, UA: NEGATIVE mg/dL
Ketones, ur: NEGATIVE mg/dL
Nitrite: NEGATIVE
Protein, ur: 30 mg/dL — AB
SPECIFIC GRAVITY, URINE: 1.011 (ref 1.005–1.030)
SQUAMOUS EPITHELIAL / LPF: NONE SEEN
pH: 7 (ref 5.0–8.0)

## 2017-10-01 LAB — CBC WITH DIFFERENTIAL/PLATELET
BASOS PCT: 0 %
Basophils Absolute: 0 10*3/uL (ref 0.0–0.1)
EOS ABS: 0 10*3/uL (ref 0.0–0.7)
EOS PCT: 0 %
HCT: 37.6 % — ABNORMAL LOW (ref 39.0–52.0)
Hemoglobin: 12.6 g/dL — ABNORMAL LOW (ref 13.0–17.0)
LYMPHS ABS: 1.4 10*3/uL (ref 0.7–4.0)
Lymphocytes Relative: 13 %
MCH: 31 pg (ref 26.0–34.0)
MCHC: 33.5 g/dL (ref 30.0–36.0)
MCV: 92.4 fL (ref 78.0–100.0)
Monocytes Absolute: 1.8 10*3/uL — ABNORMAL HIGH (ref 0.1–1.0)
Monocytes Relative: 16 %
Neutro Abs: 7.7 10*3/uL (ref 1.7–7.7)
Neutrophils Relative %: 71 %
PLATELETS: 213 10*3/uL (ref 150–400)
RBC: 4.07 MIL/uL — ABNORMAL LOW (ref 4.22–5.81)
RDW: 13.7 % (ref 11.5–15.5)
WBC: 10.9 10*3/uL — AB (ref 4.0–10.5)

## 2017-10-01 LAB — LACTIC ACID, PLASMA
LACTIC ACID, VENOUS: 0.9 mmol/L (ref 0.5–1.9)
Lactic Acid, Venous: 1.9 mmol/L (ref 0.5–1.9)

## 2017-10-01 MED ORDER — SIMVASTATIN 20 MG PO TABS
40.0000 mg | ORAL_TABLET | Freq: Every day | ORAL | Status: DC
  Administered 2017-10-01 – 2017-10-02 (×2): 40 mg via ORAL
  Filled 2017-10-01 (×3): qty 2

## 2017-10-01 MED ORDER — ENOXAPARIN SODIUM 40 MG/0.4ML ~~LOC~~ SOLN
40.0000 mg | SUBCUTANEOUS | Status: DC
  Filled 2017-10-01 (×2): qty 0.4

## 2017-10-01 MED ORDER — ONDANSETRON HCL 4 MG PO TABS: 4.0000 mg | ORAL_TABLET | Freq: Four times a day (QID) | ORAL | Status: DC | PRN

## 2017-10-01 MED ORDER — SODIUM CHLORIDE 0.9% FLUSH: 3.0000 mL | INTRAVENOUS | Status: DC | PRN

## 2017-10-01 MED ORDER — ACETAMINOPHEN 325 MG PO TABS
650.0000 mg | ORAL_TABLET | Freq: Once | ORAL | Status: AC | PRN
  Administered 2017-10-01: 650 mg via ORAL
  Filled 2017-10-01: qty 2

## 2017-10-01 MED ORDER — IOPAMIDOL (ISOVUE-300) INJECTION 61%
100.0000 mL | Freq: Once | INTRAVENOUS | Status: AC | PRN
  Administered 2017-10-01: 100 mL via INTRAVENOUS

## 2017-10-01 MED ORDER — TAMSULOSIN HCL 0.4 MG PO CAPS
0.4000 mg | ORAL_CAPSULE | Freq: Two times a day (BID) | ORAL | Status: DC
  Administered 2017-10-01 – 2017-10-04 (×5): 0.4 mg via ORAL
  Filled 2017-10-01 (×6): qty 1

## 2017-10-01 MED ORDER — DEXTROSE 5 % IV SOLN
1.0000 g | Freq: Once | INTRAVENOUS | Status: AC
  Administered 2017-10-01: 1 g via INTRAVENOUS
  Filled 2017-10-01: qty 10

## 2017-10-01 MED ORDER — SODIUM CHLORIDE 0.9% FLUSH
3.0000 mL | Freq: Two times a day (BID) | INTRAVENOUS | Status: DC
  Administered 2017-10-01 – 2017-10-04 (×2): 3 mL via INTRAVENOUS

## 2017-10-01 MED ORDER — ACETAMINOPHEN 650 MG RE SUPP: 650.0000 mg | Freq: Four times a day (QID) | RECTAL | Status: DC | PRN

## 2017-10-01 MED ORDER — ACETAMINOPHEN 325 MG PO TABS
650.0000 mg | ORAL_TABLET | Freq: Four times a day (QID) | ORAL | Status: DC | PRN
  Administered 2017-10-01 – 2017-10-02 (×2): 650 mg via ORAL
  Filled 2017-10-01 (×2): qty 2

## 2017-10-01 MED ORDER — IBUPROFEN 400 MG PO TABS
600.0000 mg | ORAL_TABLET | Freq: Once | ORAL | Status: AC
  Administered 2017-10-01: 600 mg via ORAL
  Filled 2017-10-01: qty 2

## 2017-10-01 MED ORDER — SODIUM CHLORIDE 0.9 % IV SOLN
250.0000 mL | INTRAVENOUS | Status: DC | PRN
  Administered 2017-10-01: 250 mL via INTRAVENOUS

## 2017-10-01 MED ORDER — ONDANSETRON HCL 4 MG/2ML IJ SOLN
4.0000 mg | Freq: Four times a day (QID) | INTRAMUSCULAR | Status: DC | PRN
  Administered 2017-10-01 – 2017-10-04 (×4): 4 mg via INTRAVENOUS
  Filled 2017-10-01 (×4): qty 2

## 2017-10-01 NOTE — ED Provider Notes (Signed)
[facility] EMERGENCY DEPARTMENT Provider Note   CSN: [id number] Arrival date & time: 10/01/17  1502     History   Chief Complaint Chief Complaint  Patient presents with  . Fever    HPI Brian Love is a 77 y.o. male.  HPI Patient presents with fever and presumed urinary tract infection.  Has had diarrhea somewhat chronically since radiation and chemotherapy.  Previous prostate cancer.  States he began to have a fever and thinks he could have a urinary tract infection.  He usually requires self cath but for the last several days has not been cathing and has just been urinating spontaneously.  States he gets around 4 ounces when he urinates but when he casts he would get around 8 ounces out.  States knowing he was coming to the ER he cathed and had 12 ounces come out.  Occasional cough.  Has had chronic diarrhea and earlier had left lower quadrant pain.  States he sat for a little bit and then the pain went away.  No headache.  No confusion.  Has previously been chemotherapy for his prostate cancer but finished 2 months ago. Past Medical History:  Diagnosis Date  . High cholesterol   . Prostate cancer [facility])     Patient Active Problem List   Diagnosis Date Noted  . 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 SIRS (systemic inflammatory response syndrome) ([place]) 08/11/2017  . Sepsis ([place]) 03/23/2017  . Acute pyelonephritis 03/23/2017  . Hypokalemia 03/23/2017  . UTI (urinary tract infection) 03/22/2017  . Prostate cancer ([place]) 03/22/2017  . Acute kidney injury ([place]) 03/22/2017  . Fever in adult 03/22/2017  . Acute urinary retention 03/22/2017  . High cholesterol     Past Surgical History:  Procedure Laterality Date  . CATARACT EXTRACTION    . KIDNEY STONE SURGERY         Home Medications    Prior to Admission medications   Medication Sig Start Date End Date Taking? Authorizing Provider  Coenzyme Q10 (CO Q 10 PO) Take 1 capsule by mouth daily.   Yes  [provider]  furosemide (LASIX) 20 MG tablet Take 20 mg by mouth 2 (two) times daily.   Yes [provider]  leuprolide, 6 Month, (ELIGARD) 45 MG injection Inject 45 mg into the skin every 6 (six) months. 05/15/17  Yes [provider]  ondansetron (ZOFRAN) 8 MG tablet Take 8 mg by mouth every 8 (eight) hours as needed for nausea or vomiting.  05/23/17  Yes [provider]  potassium chloride SA (K-DUR,KLOR-CON) 20 MEQ tablet Take 20 mEq by mouth daily. 09/13/17  Yes [provider]  simvastatin (ZOCOR) 40 MG tablet Take 40 mg by mouth at bedtime.   Yes [provider]  tamsulosin (FLOMAX) 0.4 MG CAPS capsule Take 0.4 mg by mouth 2 (two) times daily.    Yes [provider]    Family History History reviewed. No pertinent family history.  Social History Social History   Tobacco Use  . Smoking status: Never Smoker  . Smokeless tobacco: Never Used  Substance Use Topics  . Alcohol use: Yes    Comment: occasionally  . Drug use: No     Allergies   Patient has no known allergies.   Review of Systems Review of Systems  Constitutional: Positive for appetite change. Negative for fever.  HENT: Negative for sinus pain.   Respiratory: Negative for shortness of breath.   Cardiovascular: Negative for chest pain.  Gastrointestinal:  Positive for abdominal pain and diarrhea.  Genitourinary: Positive for difficulty urinating.  Musculoskeletal: Negative for back pain.  Neurological: Negative for weakness and numbness.  Hematological: Negative for adenopathy.  Psychiatric/Behavioral: Negative for confusion.     Physical Exam Updated Vital Signs BP 129/65 (BP Location: Right Arm)   Pulse 99   Temp (!) 100.5 F (38.1 C) (Oral)   Resp (!) 22   Ht 5\' 10"  (1.778 m)   Wt 89.8 kg (198 lb)   SpO2 93%   BMI 28.41 kg/m   Physical Exam  Constitutional: He appears well-developed.  HENT:  Head: Atraumatic.  Patient's face is  red.  Eyes: Pupils are equal, round, and reactive to light.  Neck: Neck supple.  Cardiovascular: Normal rate.  Pulmonary/Chest: Effort normal.  Abdominal: There is no tenderness.  Musculoskeletal:  No CVA tenderness.  Neurological: He is alert.  Skin: Skin is warm. Capillary refill takes less than 2 seconds.  Psychiatric: He has a normal mood and affect.     ED Treatments / Results  Labs (all labs ordered are listed, but only abnormal results are displayed) Labs Reviewed  CBC WITH DIFFERENTIAL/PLATELET - Abnormal; Notable for the following components:      Result Value   WBC 10.9 (*)    RBC 4.07 (*)    Hemoglobin 12.6 (*)    HCT 37.6 (*)    Monocytes Absolute 1.8 (*)    All other components within normal limits  BASIC METABOLIC PANEL - Abnormal; Notable for the following components:   Sodium 132 (*)    Chloride 96 (*)    Glucose, Bld 126 (*)    All other components within normal limits  URINALYSIS, ROUTINE W REFLEX MICROSCOPIC - Abnormal; Notable for the following components:   APPearance HAZY (*)    Hgb urine dipstick LARGE (*)    Protein, ur 30 (*)    Leukocytes, UA SMALL (*)    All other components within normal limits  URINE CULTURE  LACTIC ACID, PLASMA  LACTIC ACID, PLASMA    EKG  EKG Interpretation None       Radiology Dg Chest 2 View  Result Date: 10/01/2017 CLINICAL DATA:  Dry cough with fever and diarrhea EXAM: CHEST  2 VIEW COMPARISON:  08/11/2017 FINDINGS: The heart size and mediastinal contours are within normal limits. Both lungs are clear. Degenerative changes of the spine. IMPRESSION: No active cardiopulmonary disease. Electronically Signed   By: Donavan Foil M.D.   On: 10/01/2017 15:36   Ct Abdomen Pelvis W Contrast  Result Date: 10/01/2017 CLINICAL DATA:  Abdominal pain, fever and diarrhea EXAM: CT ABDOMEN AND PELVIS WITH CONTRAST TECHNIQUE: Multidetector CT imaging of the abdomen and pelvis was performed using the standard protocol following  bolus administration of intravenous contrast. CONTRAST:  150mL ISOVUE-300 IOPAMIDOL (ISOVUE-300) INJECTION 61% COMPARISON:  None. FINDINGS: Lower chest: Lung bases demonstrate no acute consolidation or effusion. Normal heart size. Fluid-filled distal esophageal hiatal hernia Hepatobiliary: Subcentimeter hypodense lesions in the dome of the liver too small to further characterize. No calcified gallstones or biliary dilatation Pancreas: Unremarkable. No pancreatic ductal dilatation or surrounding inflammatory changes. Spleen: Normal in size without focal abnormality. Adrenals/Urinary Tract: Adrenal glands are within normal limits. Cysts within the bilateral kidneys. Large 23 mm stone mid pole left kidney. There are multiple stones within both kidneys., the largest on the right measures 6 mm and is visible in the mid to lower pole. 16 mm intermediate density lesion upper pole left kidney. Mild to  moderate right hydronephrosis and mild left hydronephrosis. Both ureters are dilated. There is irregular wall thickening of the bladder with mild diverticula. Nonspecific left greater than right perinephric fat stranding. Stomach/Bowel: Stomach is within normal limits. Appendix appears normal. No evidence of bowel wall thickening, distention, or inflammatory changes. 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 No aneurysmal dilatation. No significantly enlarged lymph nodes Reproductive: Post treatment changes of the prostate with multiple seeds present. Other: Tiny fat in the right inguinal canal. Negative for free air or free fluid mild edema in the perirectal fat Musculoskeletal: Nonspecific small foci of sclerosis L2 and T11 IMPRESSION: 1. Negative for bowel obstruction or bowel wall thickening 2. Irregular wall thickening of the bladder with multiple diverticula, query cystitis. There is bilateral hydronephrosis and hydroureter but no obstructing  stone is evident. There are calcified stones within both kidneys, including a large 23 mm stone in left kidney. Nonspecific left greater than right perinephric fat stranding, could consider pyelonephritis in the appropriate clinical setting 3. Indeterminate 16 mm lesion upper pole left kidney. When the patient is clinically stable and able to follow directions and hold their breath (preferably as an outpatient) further evaluation with dedicated abdominal MRI should be considered. Electronically Signed   By: Donavan Foil M.D.   On: 10/01/2017 19:25    Procedures Procedures (including critical care time)  Medications Ordered in ED Medications  acetaminophen (TYLENOL) tablet 650 mg (650 mg Oral Given 10/01/17 1519)  iopamidol (ISOVUE-300) 61 % injection 100 mL (100 mLs Intravenous Contrast Given 10/01/17 1851)  ibuprofen (ADVIL,MOTRIN) tablet 600 mg (600 mg Oral Given 10/01/17 1917)  cefTRIAXone (ROCEPHIN) 1 g in dextrose 5 % 50 mL IVPB (0 g Intravenous Stopped 10/01/17 2039)     Initial Impression / Assessment and Plan / ED Course  I have reviewed the triage vital signs and the nursing notes.  Pertinent labs & imaging results that were available during my care of the patient were reviewed by me and considered in my medical decision making (see chart for details).     Patient with fever chills and likely urinary tract infection.  Urine somewhat nonspecific but CT scan done shows likely cystitis and likely bilateral pyelonephritis.  Febrile here but normal lactic acid.  Discussed with Dr. Tresa Moore from urology.  We will Place Foley catheter to help relieve the obstruction which likely is causing the hydronephrosis.  Likely will need no other intervention.  Final Clinical Impressions(s) / ED Diagnoses   Final diagnoses:  Urinary retention  Upper urinary tract infection    ED Discharge Orders    None       Davonna Belling, MD 10/01/17 2040

## 2017-10-01 NOTE — ED Triage Notes (Addendum)
Pt c/o of fever and diarrhea.  Pt just finished chemo treatment for prostate cancer. Temp of 102.8 in triage. VSS

## 2017-10-01 NOTE — H&P (Signed)
TRH H&P    Patient Demographics:    Trashawn Oquendo, is a 77 y.o. male  MRN: [id number]  DOB - 1941-06-23  Admit Date - 10/01/2017  Referring MD/NP/PA: Dr. Alvino Chapel  Outpatient Primary MD for the patient is Glenda Chroman, MD  Patient coming from: home  Chief Complaint  Patient presents with  . Fever      HPI:    Keyshaun Exley  is a 77 y.o. male, with history of prostate cancer status post four  cycles of chemotherapy, radiation treatment, brachytherapy with radiation seeds who came to hospital with complaints of fever. Patient says that he was self catheterizing  himself at 10 PM every night but still was urinating 4 to 5 times at night so he stopped self catheterization around Christmas time. Patient states that he usually gets 4 ounces when he urinates but 8 ounces usually when he self cath himself. Today when he self cath" 12 ounces of urine. He denies dysuria. Complains of left lower quadrant pain. Denies chest pain or shortness of breath. No nausea vomiting or diarrhea. Patient's last chemotherapy was on November 8. He is currently off chemotherapy as it caused leg swelling.  In the ED, CT of the abdomen and pelvis showed bladder changes consistent with cystitis, hydroelectric and bilateral hydronephrosis (mild). Patient empirically started on ceftriaxone. Urine culture obtained.    Review of systems:      All other systems reviewed and are negative.   With Past History of the following :    Past Medical History:  Diagnosis Date  . High cholesterol   . Prostate cancer [facility])       Past Surgical History:  Procedure Laterality Date  . CATARACT EXTRACTION    . KIDNEY STONE SURGERY        Social History:      Social History   Tobacco Use  . Smoking status: Never Smoker  . Smokeless tobacco: Never Used  Substance Use Topics  . Alcohol use: Yes    Comment: occasionally        Family History :   No family history of cancer   Home Medications:   Prior to Admission medications   Medication Sig Start Date End Date Taking? Authorizing Provider  Coenzyme Q10 (CO Q 10 PO) Take 1 capsule by mouth daily.   Yes [provider]  furosemide (LASIX) 20 MG tablet Take 20 mg by mouth 2 (two) times daily.   Yes [provider]  leuprolide, 6 Month, (ELIGARD) 45 MG injection Inject 45 mg into the skin every 6 (six) months. 05/15/17  Yes [provider]  ondansetron (ZOFRAN) 8 MG tablet Take 8 mg by mouth every 8 (eight) hours as needed for nausea or vomiting.  05/23/17  Yes [provider]  potassium chloride SA (K-DUR,KLOR-CON) 20 MEQ tablet Take 20 mEq by mouth daily. 09/13/17  Yes [provider]  simvastatin (ZOCOR) 40 MG tablet Take 40 mg by mouth at bedtime.   Yes [provider]  tamsulosin (FLOMAX) 0.4 MG CAPS capsule  Take 0.4 mg by mouth 2 (two) times daily.    Yes [provider]     Allergies:    No Known Allergies   Physical Exam:   Vitals  Blood pressure (!) 139/57, pulse 87, temperature (!) 100.5 F (38.1 C), temperature source Oral, resp. rate (!) 25, height 5\' 10"  (1.778 m), weight 89.8 kg (198 lb), SpO2 93 %.  1.  General: Appears in no acute distress  2. Psychiatric:  Intact judgement and  insight, awake alert, oriented x 3.  3. Neurologic: No focal neurological deficits, all cranial nerves intact.Strength 5/5 all 4 extremities, sensation intact all 4 extremities, plantars down going.  4. Eyes :  anicteric sclerae, moist conjunctivae with no lid lag. PERRLA.  5. ENMT:  Oropharynx clear with moist mucous membranes and good dentition  6. Neck:  supple, no cervical lymphadenopathy appriciated, No thyromegaly  7. Respiratory : Normal respiratory effort, good air movement bilaterally,clear to  auscultation bilaterally  8. Cardiovascular : RRR, no gallops, rubs or murmurs, no  leg edema  9. Gastrointestinal:  Positive bowel sounds, abdomen soft, non-tender to palpation,no hepatosplenomegaly, no rigidity or guarding       10. Skin:  No cyanosis, normal texture and turgor, no rash, lesions or ulcers  11.Musculoskeletal:  Good muscle tone,  joints appear normal , no effusions,  normal range of motion    Data Review:    CBC Recent Labs  Lab 10/01/17 1643  WBC 10.9*  HGB 12.6*  HCT 37.6*  PLT 213  MCV 92.4  MCH 31.0  MCHC 33.5  RDW 13.7  LYMPHSABS 1.4  MONOABS 1.8*  EOSABS 0.0  BASOSABS 0.0   ------------------------------------------------------------------------------------------------------------------  Chemistries  Recent Labs  Lab 10/01/17 1643  NA 132*  K 3.6  CL 96*  CO2 24  GLUCOSE 126*  BUN 18  CREATININE 1.10  CALCIUM 9.2   ------------------------------------------------------------------------------------------------------------------  -------------------------------------------------------------------------------------------- --------------------------------------------------------------------------------------------------------------- Urine analysis:    Component Value Date/Time   COLORURINE YELLOW 10/01/2017 1638   APPEARANCEUR HAZY (A) 10/01/2017 1638   LABSPEC 1.011 10/01/2017 1638   PHURINE 7.0 10/01/2017 1638   GLUCOSEU NEGATIVE 10/01/2017 1638   HGBUR LARGE (A) 10/01/2017 1638   BILIRUBINUR NEGATIVE 10/01/2017 [place] 10/01/2017 1638   PROTEINUR 30 (A) 10/01/2017 1638   NITRITE NEGATIVE 10/01/2017 1638   LEUKOCYTESUR SMALL (A) 10/01/2017 1638      Imaging Results:    Dg Chest 2 View  Result Date: 10/01/2017 CLINICAL DATA:  Dry cough with fever and diarrhea EXAM: CHEST  2 VIEW COMPARISON:  08/11/2017 FINDINGS: The heart size and mediastinal contours are within normal limits. Both lungs are clear. Degenerative changes of the spine. IMPRESSION: No active cardiopulmonary disease.  Electronically Signed   By: Donavan Foil M.D.   On: 10/01/2017 15:36   Ct Abdomen Pelvis W Contrast  Result Date: 10/01/2017 CLINICAL DATA:  Abdominal pain, fever and diarrhea EXAM: CT ABDOMEN AND PELVIS WITH CONTRAST TECHNIQUE: Multidetector CT imaging of the abdomen and pelvis was performed using the standard protocol following bolus administration of intravenous contrast. CONTRAST:  142mL ISOVUE-300 IOPAMIDOL (ISOVUE-300) INJECTION 61% COMPARISON:  None. FINDINGS: Lower chest: Lung bases demonstrate no acute consolidation or effusion. Normal heart size. Fluid-filled distal esophageal hiatal hernia Hepatobiliary: Subcentimeter hypodense lesions in the dome of the liver too small to further characterize. No calcified gallstones or biliary dilatation Pancreas: Unremarkable. No pancreatic ductal dilatation or surrounding inflammatory changes. Spleen: Normal in size without focal abnormality. Adrenals/Urinary Tract: Adrenal glands  are within normal limits. Cysts within the bilateral kidneys. Large 23 mm stone mid pole left kidney. There are multiple stones within both kidneys., the largest on the right measures 6 mm and is visible in the mid to lower pole. 16 mm intermediate density lesion upper pole left kidney. Mild to moderate right hydronephrosis and mild left hydronephrosis. Both ureters are dilated. There is irregular wall thickening of the bladder with mild diverticula. Nonspecific left greater than right perinephric fat stranding. Stomach/Bowel: Stomach is within normal limits. Appendix appears normal. No evidence of bowel wall thickening, distention, or inflammatory changes. 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 No aneurysmal dilatation. No significantly enlarged lymph nodes Reproductive: Post treatment changes of the prostate with multiple seeds present. Other: Tiny fat in the right inguinal canal. Negative for free air or  free fluid mild edema in the perirectal fat Musculoskeletal: Nonspecific small foci of sclerosis L2 and T11 IMPRESSION: 1. Negative for bowel obstruction or bowel wall thickening 2. Irregular wall thickening of the bladder with multiple diverticula, query cystitis. There is bilateral hydronephrosis and hydroureter but no obstructing stone is evident. There are calcified stones within both kidneys, including a large 23 mm stone in left kidney. Nonspecific left greater than right perinephric fat stranding, could consider pyelonephritis in the appropriate clinical setting 3. Indeterminate 16 mm lesion upper pole left kidney. When the patient is clinically stable and able to follow directions and hold their breath (preferably as an outpatient) further evaluation with dedicated abdominal MRI should be considered. Electronically Signed   By: Donavan Foil M.D.   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 Bilateral hydronephrosis/urinary retention- Foley catheter has been inserted, urology was consulted by ED physician. No further intervention recommended at this time. Continue Flomax 2. UTI- patient came with fever WBC 10,000, lactic acid 1.9. Empirically started on ceftriaxone. Urine culture has been obtained. We will follow urine culture results. 3. Prostate cancer-status post radiation/chemotherapy, followed by [facility] urology/oncology. 4. Lesion upper pole left kidney-CT scan showed indeterminate 16 MM lesion in the upper pole of left kidney. Recommend  MRI to rule out malignancy once patient is most stable.   DVT Prophylaxis-   Lovenox   AM Labs Ordered, also please review Full Orders  Family Communication: Admission, patients condition and plan of care including tests being ordered have been discussed with the patient  who  indicate understanding and agree with the plan and Code Status.  Code Status: full code  Admission status: inpatient  Time spent in minutes : 60 minutes   Oswald Hillock M.D on 10/01/2017 at 9:43 PM  Between 7am to 7pm - Pager - [phone]. After 7pm go to [email or web address] - password [facility]  [facility] - Office  [phone]

## 2017-10-02 DIAGNOSIS — G620 Drug-induced polyneuropathy: Secondary | ICD-10-CM

## 2017-10-02 DIAGNOSIS — T451X5A Adverse effect of antineoplastic and immunosuppressive drugs, initial encounter: Secondary | ICD-10-CM

## 2017-10-02 DIAGNOSIS — R338 Other retention of urine: Secondary | ICD-10-CM

## 2017-10-02 DIAGNOSIS — N132 Hydronephrosis with renal and ureteral calculous obstruction: Secondary | ICD-10-CM

## 2017-10-02 LAB — CBC
HCT: 37.9 % — ABNORMAL LOW (ref 39.0–52.0)
HEMOGLOBIN: 12.6 g/dL — AB (ref 13.0–17.0)
MCH: 31.2 pg (ref 26.0–34.0)
MCHC: 33.2 g/dL (ref 30.0–36.0)
MCV: 93.8 fL (ref 78.0–100.0)
PLATELETS: 196 10*3/uL (ref 150–400)
RBC: 4.04 MIL/uL — AB (ref 4.22–5.81)
RDW: 13.8 % (ref 11.5–15.5)
WBC: 20.7 10*3/uL — ABNORMAL HIGH (ref 4.0–10.5)

## 2017-10-02 LAB — COMPREHENSIVE METABOLIC PANEL WITH GFR
ALT: 22 U/L (ref 17–63)
AST: 35 U/L (ref 15–41)
Albumin: 3.6 g/dL (ref 3.5–5.0)
Alkaline Phosphatase: 40 U/L (ref 38–126)
Anion gap: 11 (ref 5–15)
BUN: 19 mg/dL (ref 6–20)
CO2: 26 mmol/L (ref 22–32)
Calcium: 9.2 mg/dL (ref 8.9–10.3)
Chloride: 100 mmol/L — ABNORMAL LOW (ref 101–111)
Creatinine, Ser: 1.21 mg/dL (ref 0.61–1.24)
GFR calc Af Amer: >60 mL/min
GFR calc non Af Amer: 56 mL/min — ABNORMAL LOW
Glucose, Bld: 142 mg/dL — ABNORMAL HIGH (ref 65–99)
Potassium: 3.7 mmol/L (ref 3.5–5.1)
Sodium: 137 mmol/L (ref 135–145)
Total Bilirubin: 0.8 mg/dL (ref 0.3–1.2)
Total Protein: 7 g/dL (ref 6.5–8.1)

## 2017-10-02 MED ORDER — ACETAMINOPHEN 325 MG PO TABS: 650.0000 mg | ORAL_TABLET | Freq: Four times a day (QID) | ORAL | Status: DC

## 2017-10-02 MED ORDER — KETOROLAC TROMETHAMINE 30 MG/ML IJ SOLN
30.0000 mg | Freq: Four times a day (QID) | INTRAMUSCULAR | Status: DC
  Administered 2017-10-02 – 2017-10-03 (×3): 30 mg via INTRAVENOUS
  Filled 2017-10-02 (×3): qty 1

## 2017-10-02 MED ORDER — LEVOFLOXACIN IN D5W 500 MG/100ML IV SOLN
500.0000 mg | INTRAVENOUS | Status: DC
  Administered 2017-10-02 – 2017-10-04 (×3): 500 mg via INTRAVENOUS
  Filled 2017-10-02 (×3): qty 100

## 2017-10-02 MED ORDER — CEFTRIAXONE SODIUM 1 G IJ SOLR
1.0000 g | INTRAMUSCULAR | Status: DC
  Filled 2017-10-02: qty 10

## 2017-10-02 MED ORDER — ACETAMINOPHEN 650 MG RE SUPP
650.0000 mg | Freq: Four times a day (QID) | RECTAL | Status: DC
  Administered 2017-10-02: 650 mg via RECTAL
  Filled 2017-10-02: qty 1

## 2017-10-02 MED ORDER — KETOROLAC TROMETHAMINE 30 MG/ML IJ SOLN
15.0000 mg | Freq: Four times a day (QID) | INTRAMUSCULAR | Status: DC
  Administered 2017-10-02: 15 mg via INTRAVENOUS
  Filled 2017-10-02: qty 1

## 2017-10-02 MED ORDER — ACETAMINOPHEN 325 MG PO TABS
650.0000 mg | ORAL_TABLET | Freq: Four times a day (QID) | ORAL | Status: DC
  Administered 2017-10-02 – 2017-10-03 (×4): 650 mg via ORAL
  Filled 2017-10-02 (×4): qty 2

## 2017-10-02 MED ORDER — SODIUM CHLORIDE 0.9 % IV SOLN
INTRAVENOUS | Status: AC
  Administered 2017-10-02 – 2017-10-03 (×2): via INTRAVENOUS

## 2017-10-02 NOTE — Progress Notes (Addendum)
Patient requesting that the cooling blanket be cut off because he was sweating and concerned that cooling blanket was the reason because the reading was 108.  Explained to patient that the cooling blanket was on automatic and would heat and cool to keep his temperature normal and that the reason he was sweating was likely due to the fact that his temperature had broken and was within a normal range (98.9).  Patient unable to comprehend what I was telling him and is fixated on the numbers.  Cut the cooling blanket off and left the temperature probe in to monitor temp.  Will continue to monitor.

## 2017-10-02 NOTE — Progress Notes (Signed)
Pt oral temp was 102.5, gave oral Tylenol and re-checked an hour later and it was 103.8. MD made aware, awaiting response. Cooling measures given. Will continue to monitor and update MD as necessary.

## 2017-10-02 NOTE — Progress Notes (Signed)
Pharmacy Antibiotic Note  GEOVANNY SARTIN is a 77 y.o. male admitted on 10/01/2017 with UTI.  Pharmacy has been consulted for Levaquin dosing.  Plan: Levaquin 500 mg IV Q24 hours Monitor labs, C/S, and symptoms.  Height: 5\' 10"  (177.8 cm) Weight: 193 lb 9 oz (87.8 kg) IBW/kg (Calculated) : 73  Temp (24hrs), Avg:101.4 F (38.6 C), Min:99.4 F (37.4 C), Max:103.8 F (39.9 C)  Recent Labs  Lab 10/01/17 1643 10/01/17 1907 10/02/17 0534  WBC 10.9*  --  20.7*  CREATININE 1.10  --  1.21  LATICACIDVEN 0.9 1.9  --     Estimated Creatinine Clearance: 58 mL/min (by C-G formula based on SCr of 1.21 mg/dL).    No Known Allergies  Antimicrobials this admission: ceftriaxone 1/15 >> 1/16  levaquin 1/16 >>   Dose adjustments this admission:   Microbiology results:  1/16 UCx: in progress    Thank you for allowing pharmacy to be a part of this patient's care.  Margot Ables, PharmD Clinical Pharmacist 10/02/2017 11:30 AM

## 2017-10-02 NOTE — Progress Notes (Addendum)
PROGRESS NOTE  Brian Love  PYK:[id number]  DOB: [birth year]  DOA: 10/01/2017 PCP: Glenda Chroman, MD  Brief Admission Hx: Brian Love  is a 77 y.o. male, with history of prostate cancer status post four  cycles of chemotherapy, radiation treatment, brachytherapy with radiation seeds who came to hospital with complaints of fever. Patient says that he was self catheterizing  himself at 10 PM every night but still was urinating 4 to 5 times at night so he stopped self catheterization around Christmas time.  Patient states that he usually gets 4 ounces when he urinates but 8 ounces usually when he self cath himself. Today when he self cath" 12 ounces of urine.  He denies dysuria. Complains of left lower quadrant pain.  Denies chest pain or shortness of breath.  No nausea vomiting or diarrhea.  Patient's last chemotherapy was on November 8. He is currently off chemotherapy as it caused leg swelling.  In the ED, CT of the abdomen and pelvis showed bladder changes consistent with cystitis and bilateral hydronephrosis. Patient empirically started on ceftriaxone.   Urine culture pending.  MDM/Assessment & Plan:   1. UTI / pyelonephritis / cystitis - urine culture pending, IV ceftriaxone given on admission, given bump in WBC will Rx levofloxacin.  Urology consult pending.  I spoke with wife and she is contemplating having patient transferred to [facility].  Continue to treat him medically for now.  He is nontoxic appearing and vitals are stable.   2. Fever - secondary to UTI - treating symptomatically with tylenol, toradol.  Obtain blood cultures.  3. Bilateral hydronephrosis - suspect from urinary retention, foley placed, urology consult requested.   4. Leukocytosis - marked bump in WBC overnight, changed antibiotics to levofloxacin IV.   5. Sepsis secondary to UTI - continue supportive care.   6. Prostate cancer - s/p radiation/chemotherapy, followed by [facility] urology/oncology.  7. Lesion  upper pole left kidney - CT scan showed indeterminate 16 mm lesion upper pole left kidney, to be followed up with MRI when medically stable.  8. Nephrolithiasis - Pt says that he is scheduled to have outpatient urology follow up regarding this.   DVT prophylaxis: lovenox Code Status: Full  Family Communication: wife Disposition Plan: TBD   Consultants:  Urology  Subjective: Pt having fever, otherwise no complaints.      Objective: Vitals:   10/02/17 0936 10/02/17 1044 10/02/17 1053 10/02/17 1131  BP: 119/60  (!) 108/58   Pulse: 92  86   Resp: 20 (!) 26 (!) 23   Temp: (!) 102.5 F (39.2 C) (!) 103.8 F (39.9 C)  100.2 F (37.9 C)  TempSrc: Oral Oral  Oral  SpO2: 92%  94%   Weight:      Height:        Intake/Output Summary (Last 24 hours) at 10/02/2017 1135 Last data filed at 10/02/2017 5397 Gross per 24 hour  Intake 53 ml  Output 1600 ml  Net -1547 ml   Filed Weights   10/01/17 1513 10/01/17 2321  Weight: 89.8 kg (198 lb) 87.8 kg (193 lb 9 oz)     REVIEW OF SYSTEMS  As per history otherwise all reviewed and reported negative  Exam:  General exam: awake, alert, NAD, cooperative.   Respiratory system: Clear. No increased work of breathing. Cardiovascular system: S1 & S2 heard. No JVD, murmurs, gallops, clicks or pedal edema. Gastrointestinal system: Abdomen is nondistended, soft and nontender. Normal bowel sounds heard. GU: foley to gravity:  amber colored urine freely draining.  Central nervous system: Alert and oriented. No focal neurological deficits. Extremities: no CCE.  Data Reviewed: Basic Metabolic Panel: Recent Labs  Lab 10/01/17 1643 10/02/17 0534  NA 132* 137  K 3.6 3.7  CL 96* 100*  CO2 24 26  GLUCOSE 126* 142*  BUN 18 19  CREATININE 1.10 1.21  CALCIUM 9.2 9.2   Liver Function Tests: Recent Labs  Lab 10/02/17 0534  AST 35  ALT 22  ALKPHOS 40  BILITOT 0.8  PROT 7.0  ALBUMIN 3.6   No results for input(s): LIPASE, AMYLASE in the  last 168 hours. No results for input(s): AMMONIA in the last 168 hours. CBC: Recent Labs  Lab 10/01/17 1643 10/02/17 0534  WBC 10.9* 20.7*  NEUTROABS 7.7  --   HGB 12.6* 12.6*  HCT 37.6* 37.9*  MCV 92.4 93.8  PLT 213 196   Cardiac Enzymes: No results for input(s): CKTOTAL, CKMB, CKMBINDEX, TROPONINI in the last 168 hours. CBG (last 3)  No results for input(s): GLUCAP in the last 72 hours. No results found for this or any previous visit (from the past 240 hour(s)).   Studies: Dg Chest 2 View  Result Date: 10/01/2017 CLINICAL DATA:  Dry cough with fever and diarrhea EXAM: CHEST  2 VIEW COMPARISON:  08/11/2017 FINDINGS: The heart size and mediastinal contours are within normal limits. Both lungs are clear. Degenerative changes of the spine. IMPRESSION: No active cardiopulmonary disease. Electronically Signed   By: Donavan Foil M.D.   On: 10/01/2017 15:36   Ct Abdomen Pelvis W Contrast  Result Date: 10/01/2017 CLINICAL DATA:  Abdominal pain, fever and diarrhea EXAM: CT ABDOMEN AND PELVIS WITH CONTRAST TECHNIQUE: Multidetector CT imaging of the abdomen and pelvis was performed using the standard protocol following bolus administration of intravenous contrast. CONTRAST:  187mL ISOVUE-300 IOPAMIDOL (ISOVUE-300) INJECTION 61% COMPARISON:  None. FINDINGS: Lower chest: Lung bases demonstrate no acute consolidation or effusion. Normal heart size. Fluid-filled distal esophageal hiatal hernia Hepatobiliary: Subcentimeter hypodense lesions in the dome of the liver too small to further characterize. No calcified gallstones or biliary dilatation Pancreas: Unremarkable. No pancreatic ductal dilatation or surrounding inflammatory changes. Spleen: Normal in size without focal abnormality. Adrenals/Urinary Tract: Adrenal glands are within normal limits. Cysts within the bilateral kidneys. Large 23 mm stone mid pole left kidney. There are multiple stones within both kidneys., the largest on the right measures  6 mm and is visible in the mid to lower pole. 16 mm intermediate density lesion upper pole left kidney. Mild to moderate right hydronephrosis and mild left hydronephrosis. Both ureters are dilated. There is irregular wall thickening of the bladder with mild diverticula. Nonspecific left greater than right perinephric fat stranding. Stomach/Bowel: Stomach is within normal limits. Appendix appears normal. No evidence of bowel wall thickening, distention, or inflammatory changes. 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 No aneurysmal dilatation. No significantly enlarged lymph nodes Reproductive: Post treatment changes of the prostate with multiple seeds present. Other: Tiny fat in the right inguinal canal. Negative for free air or free fluid mild edema in the perirectal fat Musculoskeletal: Nonspecific small foci of sclerosis L2 and T11 IMPRESSION: 1. Negative for bowel obstruction or bowel wall thickening 2. Irregular wall thickening of the bladder with multiple diverticula, query cystitis. There is bilateral hydronephrosis and hydroureter but no obstructing stone is evident. There are calcified stones within both kidneys, including a large 23 mm stone in left kidney. Nonspecific left greater  than right perinephric fat stranding, could consider pyelonephritis in the appropriate clinical setting 3. Indeterminate 16 mm lesion upper pole left kidney. When the patient is clinically stable and able to follow directions and hold their breath (preferably as an outpatient) further evaluation with dedicated abdominal MRI should be considered. Electronically Signed   By: Donavan Foil M.D.   On: 10/01/2017 19:25   Scheduled Meds: . acetaminophen  650 mg Oral Q6H  . enoxaparin (LOVENOX) injection  40 mg Subcutaneous Q24H  . ketorolac  15 mg Intravenous Q6H  . simvastatin  40 mg Oral QHS  . sodium chloride flush  3 mL Intravenous Q12H  .  tamsulosin  0.4 mg Oral BID   Continuous Infusions: . sodium chloride 250 mL (10/01/17 2345)  . sodium chloride 75 mL/hr at 10/02/17 1128  . levofloxacin (LEVAQUIN) IV     Active Problems:   UTI (urinary tract infection)   Prostate cancer ([place])   Acute urinary retention   Chemotherapy-induced neuropathy ([place])   Hydronephrosis  Time spent:   Irwin Brakeman, MD, FAAFP [facility] Pager [phone]  If 7PM-7AM, please contact night-coverage [email or web address] Password [email or web address] 10/02/2017, 11:35 AM    LOS: 1 day

## 2017-10-02 NOTE — Progress Notes (Signed)
Per patient request, cooling blanket ordered and applied to pt at 1815. Settings are for 98.6, normothermia.

## 2017-10-02 NOTE — Consult Note (Signed)
Urology Consult  Referring physician: Dr. Wynetta Emery Reason for referral: bilateral hydronephrosis, nephrolithiasis  Chief Complaint: urinary frequency  History of Present Illness: Brian Love is a 77yo with a hx of prostate cancer status post brachytherapy and docetaxal, BPH with urinary retention, and nephrolithiasis admitted with sepsis from a urinary source. He developed a fever of 103 at home yesterday and presented to the ER. A CT scan was obtained which showed moderate bilateral hydroureteronephrosis to to level of the bladder. It also showed bilateral nonobstructing renal calculi and bilateral perinephric stranding. He has a history of prostate cancer and underwent brachytherapy by Dr. Tresa Endo in 02/2017. He also completed 4 round of docetaxal chemotherapy. Since brachytherapy he has been unable to reliably empty his bladder. He is on BID flomax. He was initially managed with chronic indwelling foley until 04/2017 when he began to perform CIC. He was performing CIC up until 6 weeks ago when he thought he was emptying his bladder. He continued to have urinary frequency, urgency, and nocturia 4-5x. No hematuria or dysuria. His last stone event was in 1968. He was however, diagnosed with bilateral renal calculi which are being managed by Dr. Leo Grosser at [facility]. Currently he denies any abdominal or flank pain. No other associated symptoms. No exacerbating/allevaiting events  Past Medical History:  Diagnosis Date  . High cholesterol   . Prostate cancer [facility])    Past Surgical History:  Procedure Laterality Date  . CATARACT EXTRACTION    . KIDNEY STONE SURGERY      Medications: I have reviewed the patient's current medications. Allergies: No Known Allergies  History reviewed. No pertinent family history. Social History:  reports that  has never smoked. he has never used smokeless tobacco. He reports that he drinks alcohol. He reports that he does not use drugs.  Review of Systems   Constitutional: Positive for chills, fever and malaise/fatigue.  Cardiovascular: Positive for leg swelling.  Gastrointestinal: Positive for nausea.  Neurological: Positive for weakness.  All other systems reviewed and are negative.   Physical Exam:  Vital signs in last 24 hours: Temp:  [98.8 F (37.1 C)-104.1 F (40.1 C)] 99 F (37.2 C) (01/16 2023) Pulse Rate:  [83-108] 83 (01/16 2023) Resp:  [18-26] 18 (01/16 2023) BP: (107-167)/(57-87) 107/57 (01/16 2023) SpO2:  [92 %-98 %] 98 % (01/16 2023) Weight:  [87.8 kg (193 lb 9 oz)] 87.8 kg (193 lb 9 oz) (01/15 2321) Physical Exam  Constitutional: He is oriented to person, place, and time. He appears well-developed and well-nourished.  HENT:  Head: Normocephalic and atraumatic.  Eyes: EOM are normal. Pupils are equal, round, and reactive to light.  Neck: Normal range of motion. No thyromegaly present.  Cardiovascular: Normal rate and regular rhythm.  Respiratory: Effort normal. No respiratory distress.  GI: Soft. He exhibits no distension and no mass. There is no tenderness. There is no rebound and no guarding. Hernia confirmed negative in the right inguinal area and confirmed negative in the left inguinal area.  Genitourinary: Testes normal and penis normal.  Musculoskeletal: Normal range of motion. He exhibits edema.  Lymphadenopathy:       Right: No inguinal adenopathy present.       Left: No inguinal adenopathy present.  Neurological: He is alert and oriented to person, place, and time.  Skin: Skin is warm and dry.  Psychiatric: He has a normal mood and affect. His behavior is normal. Judgment and thought content normal.    Laboratory Data:  Results for orders placed  or performed during the hospital encounter of 10/01/17 (from the past 72 hour(s))  Urinalysis, Routine w reflex microscopic     Status: Abnormal   Collection Time: 10/01/17  4:38 PM  Result Value Ref Range   Color, Urine YELLOW YELLOW   APPearance HAZY (A)  CLEAR   Specific Gravity, Urine 1.011 1.005 - 1.030   pH 7.0 5.0 - 8.0   Glucose, UA NEGATIVE NEGATIVE mg/dL   Hgb urine dipstick LARGE (A) NEGATIVE   Bilirubin Urine NEGATIVE NEGATIVE   Ketones, ur NEGATIVE NEGATIVE mg/dL   Protein, ur 30 (A) NEGATIVE mg/dL   Nitrite NEGATIVE NEGATIVE   Leukocytes, UA SMALL (A) NEGATIVE   RBC / HPF TOO NUMEROUS TO COUNT 0 - 5 RBC/hpf   WBC, UA TOO NUMEROUS TO COUNT 0 - 5 WBC/hpf   Bacteria, UA NONE SEEN NONE SEEN   Squamous Epithelial / LPF NONE SEEN NONE SEEN   Mucus PRESENT    Budding Yeast PRESENT   CBC with Differential     Status: Abnormal   Collection Time: 10/01/17  4:43 PM  Result Value Ref Range   WBC 10.9 (H) 4.0 - 10.5 K/uL   RBC 4.07 (L) 4.22 - 5.81 MIL/uL   Hemoglobin 12.6 (L) 13.0 - 17.0 g/dL   HCT 37.6 (L) 39.0 - 52.0 %   MCV 92.4 78.0 - 100.0 fL   MCH 31.0 26.0 - 34.0 pg   MCHC 33.5 30.0 - 36.0 g/dL   RDW 13.7 11.5 - 15.5 %   Platelets 213 150 - 400 K/uL   Neutrophils Relative % 71 %   Neutro Abs 7.7 1.7 - 7.7 K/uL   Lymphocytes Relative 13 %   Lymphs Abs 1.4 0.7 - 4.0 K/uL   Monocytes Relative 16 %   Monocytes Absolute 1.8 (H) 0.1 - 1.0 K/uL   Eosinophils Relative 0 %   Eosinophils Absolute 0.0 0.0 - 0.7 K/uL   Basophils Relative 0 %   Basophils Absolute 0.0 0.0 - 0.1 K/uL  Basic metabolic panel     Status: Abnormal   Collection Time: 10/01/17  4:43 PM  Result Value Ref Range   Sodium 132 (L) 135 - 145 mmol/L   Potassium 3.6 3.5 - 5.1 mmol/L   Chloride 96 (L) 101 - 111 mmol/L   CO2 24 22 - 32 mmol/L   Glucose, Bld 126 (H) 65 - 99 mg/dL   BUN 18 6 - 20 mg/dL   Creatinine, Ser 1.10 0.61 - 1.24 mg/dL   Calcium 9.2 8.9 - 10.3 mg/dL   GFR calc non Af Amer >60 >60 mL/min   GFR calc Af Amer >60 >60 mL/min    Comment: (NOTE) The eGFR has been calculated using the CKD EPI equation. This calculation has not been validated in all clinical situations. eGFR's persistently <60 mL/min signify possible Chronic  Kidney Disease.    Anion gap 12 5 - 15  Lactic acid, plasma     Status: None   Collection Time: 10/01/17  4:43 PM  Result Value Ref Range   Lactic Acid, Venous 0.9 0.5 - 1.9 mmol/L  Lactic acid, plasma     Status: None   Collection Time: 10/01/17  7:07 PM  Result Value Ref Range   Lactic Acid, Venous 1.9 0.5 - 1.9 mmol/L  CBC     Status: Abnormal   Collection Time: 10/02/17  5:34 AM  Result Value Ref Range   WBC 20.7 (H) 4.0 - 10.5 K/uL   RBC 4.04 (  L) 4.22 - 5.81 MIL/uL   Hemoglobin 12.6 (L) 13.0 - 17.0 g/dL   HCT 37.9 (L) 39.0 - 52.0 %   MCV 93.8 78.0 - 100.0 fL   MCH 31.2 26.0 - 34.0 pg   MCHC 33.2 30.0 - 36.0 g/dL   RDW 13.8 11.5 - 15.5 %   Platelets 196 150 - 400 K/uL  Comprehensive metabolic panel     Status: Abnormal   Collection Time: 10/02/17  5:34 AM  Result Value Ref Range   Sodium 137 135 - 145 mmol/L   Potassium 3.7 3.5 - 5.1 mmol/L   Chloride 100 (L) 101 - 111 mmol/L   CO2 26 22 - 32 mmol/L   Glucose, Bld 142 (H) 65 - 99 mg/dL   BUN 19 6 - 20 mg/dL   Creatinine, Ser 1.21 0.61 - 1.24 mg/dL   Calcium 9.2 8.9 - 10.3 mg/dL   Total Protein 7.0 6.5 - 8.1 g/dL   Albumin 3.6 3.5 - 5.0 g/dL   AST 35 15 - 41 U/L   ALT 22 17 - 63 U/L   Alkaline Phosphatase 40 38 - 126 U/L   Total Bilirubin 0.8 0.3 - 1.2 mg/dL   GFR calc non Af Amer 56 (L) >60 mL/min   GFR calc Af Amer >60 >60 mL/min    Comment: (NOTE) The eGFR has been calculated using the CKD EPI equation. This calculation has not been validated in all clinical situations. eGFR's persistently <60 mL/min signify possible Chronic Kidney Disease.    Anion gap 11 5 - 15  Culture, blood (Routine X 2) w Reflex to ID Panel     Status: None (Preliminary result)   Collection Time: 10/02/17 12:18 PM  Result Value Ref Range   Specimen Description BLOOD RIGHT HAND    Special Requests      BOTTLES DRAWN AEROBIC AND ANAEROBIC Blood Culture adequate volume   Culture PENDING    Report Status PENDING   Culture, blood  (Routine X 2) w Reflex to ID Panel     Status: None (Preliminary result)   Collection Time: 10/02/17 12:18 PM  Result Value Ref Range   Specimen Description BLOOD RIGHT ARM    Special Requests      BOTTLES DRAWN AEROBIC AND ANAEROBIC Blood Culture results may not be optimal due to an excessive volume of blood received in culture bottles   Culture PENDING    Report Status PENDING    Recent Results (from the past 240 hour(s))  Culture, blood (Routine X 2) w Reflex to ID Panel     Status: None (Preliminary result)   Collection Time: 10/02/17 12:18 PM  Result Value Ref Range Status   Specimen Description BLOOD RIGHT HAND  Final   Special Requests   Final    BOTTLES DRAWN AEROBIC AND ANAEROBIC Blood Culture adequate volume   Culture PENDING  Incomplete   Report Status PENDING  Incomplete  Culture, blood (Routine X 2) w Reflex to ID Panel     Status: None (Preliminary result)   Collection Time: 10/02/17 12:18 PM  Result Value Ref Range Status   Specimen Description BLOOD RIGHT ARM  Final   Special Requests   Final    BOTTLES DRAWN AEROBIC AND ANAEROBIC Blood Culture results may not be optimal due to an excessive volume of blood received in culture bottles   Culture PENDING  Incomplete   Report Status PENDING  Incomplete   Creatinine: Recent Labs    10/01/17 1643 10/02/17 0534  CREATININE 1.10 1.21   Baseline Creatinine: 1.1  Impression/Assessment:  76yo with urinary retention, bilateral hydronephrosis, bilateral renal calculi, pyelonephritis  Plan:  1. Urinary retention: We discussed the management of his urinary retention including chronic indwelling foley, CIC, and surgical BPH therapy. Given his recent brachytherapy I cautioned him about surgical BPH therapy and the risk of incontinence. We agreed to have the foley remain in place for 2 weeks until he follows up with Dr. Rosana Hoes and at that time he can resume CIC every 3-4 hours. 2. Bilateral hydronephrosis: I discussed the causes  of hydronephrosis with the patient and the likelihood this is related to urinary retention and possibly poor bladder compliance. Please perform renal US tomorrow to ensure resolution of hydronephrosis with the indwelling foley 3. Bilateral renal calculi: The patient has an appointment with Dr. Leo Grosser in 2 weeks to discuss PCNL 4. Bilateral pyelonephritis: Please continue broad spectrum antibiotics pending his urine culture. He will need 2 week of culture specific antibiotics.   Nicolette Bang 10/02/2017, 9:20 PM

## 2017-10-03 ENCOUNTER — Inpatient Hospital Stay (HOSPITAL_COMMUNITY): Payer: Medicare Other

## 2017-10-03 LAB — CBC WITH DIFFERENTIAL/PLATELET
Basophils Absolute: 0 10*3/uL (ref 0.0–0.1)
Basophils Relative: 0 %
Eosinophils Absolute: 0 10*3/uL (ref 0.0–0.7)
Eosinophils Relative: 0 %
HCT: 34.9 % — ABNORMAL LOW (ref 39.0–52.0)
HEMOGLOBIN: 11.3 g/dL — AB (ref 13.0–17.0)
Lymphocytes Relative: 7 %
Lymphs Abs: 1.2 10*3/uL (ref 0.7–4.0)
MCH: 30.5 pg (ref 26.0–34.0)
MCHC: 32.4 g/dL (ref 30.0–36.0)
MCV: 94.1 fL (ref 78.0–100.0)
MONO ABS: 1.7 10*3/uL — AB (ref 0.1–1.0)
Monocytes Relative: 10 %
NEUTROS PCT: 83 %
Neutro Abs: 13.9 10*3/uL — ABNORMAL HIGH (ref 1.7–7.7)
PLATELETS: 174 10*3/uL (ref 150–400)
RBC: 3.71 MIL/uL — ABNORMAL LOW (ref 4.22–5.81)
RDW: 14.2 % (ref 11.5–15.5)
WBC: 16.8 10*3/uL — ABNORMAL HIGH (ref 4.0–10.5)

## 2017-10-03 LAB — COMPREHENSIVE METABOLIC PANEL
ALK PHOS: 39 U/L (ref 38–126)
ALT: 21 U/L (ref 17–63)
AST: 30 U/L (ref 15–41)
Albumin: 3.1 g/dL — ABNORMAL LOW (ref 3.5–5.0)
Anion gap: 10 (ref 5–15)
BUN: 29 mg/dL — ABNORMAL HIGH (ref 6–20)
CALCIUM: 8.7 mg/dL — AB (ref 8.9–10.3)
CO2: 26 mmol/L (ref 22–32)
CREATININE: 1.41 mg/dL — AB (ref 0.61–1.24)
Chloride: 98 mmol/L — ABNORMAL LOW (ref 101–111)
GFR, EST AFRICAN AMERICAN: 54 mL/min — AB (ref >60)
GFR, EST NON AFRICAN AMERICAN: 47 mL/min — AB (ref >60)
Glucose, Bld: 124 mg/dL — ABNORMAL HIGH (ref 65–99)
Potassium: 3.6 mmol/L (ref 3.5–5.1)
Sodium: 134 mmol/L — ABNORMAL LOW (ref 135–145)
TOTAL PROTEIN: 6.3 g/dL — AB (ref 6.5–8.1)
Total Bilirubin: 0.5 mg/dL (ref 0.3–1.2)

## 2017-10-03 MED ORDER — SODIUM CHLORIDE 0.9 % IV SOLN
INTRAVENOUS | Status: DC
  Administered 2017-10-04 (×2): via INTRAVENOUS

## 2017-10-03 MED ORDER — ACETAMINOPHEN 650 MG RE SUPP
650.0000 mg | RECTAL | Status: DC
  Administered 2017-10-03 – 2017-10-04 (×4): 650 mg via RECTAL
  Filled 2017-10-03 (×4): qty 1

## 2017-10-03 MED ORDER — ALUM & MAG HYDROXIDE-SIMETH 200-200-20 MG/5ML PO SUSP
30.0000 mL | Freq: Four times a day (QID) | ORAL | Status: DC | PRN
  Administered 2017-10-03: 30 mL via ORAL
  Filled 2017-10-03: qty 30

## 2017-10-03 MED ORDER — ACETAMINOPHEN 325 MG PO TABS
650.0000 mg | ORAL_TABLET | ORAL | Status: DC
  Administered 2017-10-03: 650 mg via ORAL
  Filled 2017-10-03 (×2): qty 2

## 2017-10-03 NOTE — Progress Notes (Addendum)
Pt continues to have elevated temperature, tylenol provided along with personal fan and cool compresses. Pt currently refusing cooling blanket. Will continue to monitor.

## 2017-10-03 NOTE — Care Management Note (Signed)
Case Management Note  Patient Details  Name: Brian Love MRN: [id number] Date of Birth: October 17, 1940  Subjective/Objective: Chart reviewed.  UTI, prostated cancer. Admitted from home, lives with wife. He is ind with ALD's. He has been receiving chemo for prostate Ca.  He is ind with ADL's, he drives, he has no HH or DME needs pta. Renal US today. Will DC with foley.                  Action/Plan:CM following for needs.   Expected Discharge Date:  10/04/17               Expected Discharge Plan:  Home/Self Care  In-House Referral:     Discharge planning Services     Post Acute Care Choice:    Choice offered to:     DME Arranged:    DME Agency:     HH Arranged:    HH Agency:     Status of Service:  In process, will continue to follow  If discussed at Long Length of Stay Meetings, dates discussed:    Additional Comments:  Shyvonne Chastang, Chauncey Reading, RN 10/03/2017, 1:05 PM

## 2017-10-03 NOTE — Progress Notes (Signed)
PROGRESS NOTE  [name]  EXH:[id number]  DOB: [birth year]  DOA: 10/01/2017 PCP: Glenda Chroman, MD  Brief Admission Hx: Brian Love  is a 77 y.o. male, with history of prostate cancer status post four  cycles of chemotherapy, radiation treatment, brachytherapy with radiation seeds who came to hospital with complaints of fever. Patient says that he was self catheterizing  himself at 10 PM every night but still was urinating 4 to 5 times at night so he stopped self catheterization around Christmas time.  Patient states that he usually gets 4 ounces when he urinates but 8 ounces usually when he self cath himself. Today when he self cath" 12 ounces of urine.  He denies dysuria. Complains of left lower quadrant pain.  Denies chest pain or shortness of breath.  No nausea vomiting or diarrhea.  Patient's last chemotherapy was on November 8. He is currently off chemotherapy as it caused leg swelling.  In the ED, CT of the abdomen and pelvis showed bladder changes consistent with cystitis and bilateral hydronephrosis. Patient empirically started on ceftriaxone.   Urine culture pending.  MDM/Assessment & Plan:   1. UTI / pyelonephritis / cystitis - urine culture pending, IV ceftriaxone given on admission, given bump in WBC will Rx levofloxacin.  Urology consult appreciated.  Urology recommended repeating US renal today to make sure hydronephrosis is improving with catheter placement.   Continue to treat him medically for now.  He is nontoxic appearing and vitals are improving.   2. Fever - secondary to UTI - treating symptomatically with tylenol, toradol.  Follow blood cultures.  3. Bilateral hydronephrosis - suspect from urinary retention, foley placed, urology consult requested.  US renal ordered to follow up.  4. Leukocytosis - trending down now, following.    5. Sepsis secondary to UTI - continue supportive care.   6. Prostate cancer - s/p radiation/chemotherapy, followed by [facility]  urology/oncology.  7. Lesion upper pole left kidney - CT scan showed indeterminate 16 mm lesion upper pole left kidney, to be followed up with MRI when medically stable.  8. Nephrolithiasis - Pt says that he is scheduled to have outpatient urology follow up regarding this.   DVT prophylaxis: lovenox Code Status: Full  Family Communication: wife Disposition Plan: Possibly home in 1-2 days if continues to improve  Consultants:  Urology  Subjective: Pt reports that he is starting to feel better.        Objective: Vitals:   10/03/17 0341 10/03/17 0631 10/03/17 0835 10/03/17 1147  BP: (!) 93/52     Pulse: 79     Resp: 18     Temp: 100 F (37.8 C) 100 F (37.8 C) 99.9 F (37.7 C) (!) 101.2 F (38.4 C)  TempSrc: Oral Rectal Oral Oral  SpO2: 96%     Weight:      Height:        Intake/Output Summary (Last 24 hours) at 10/03/2017 1214 Last data filed at 10/03/2017 0344 Gross per 24 hour  Intake 517.5 ml  Output 1000 ml  Net -482.5 ml   Filed Weights   10/01/17 1513 10/01/17 2321  Weight: 89.8 kg (198 lb) 87.8 kg (193 lb 9 oz)    REVIEW OF SYSTEMS  As per history otherwise all reviewed and reported negative  Exam:  General exam: awake, alert, NAD, cooperative.   Respiratory system: Clear. No increased work of breathing. Cardiovascular system: S1 & S2 heard. No JVD, murmurs, gallops, clicks or pedal edema. Gastrointestinal system:  Abdomen is nondistended, soft and nontender. Normal bowel sounds heard. GU: foley to gravity: amber colored urine freely draining.  Central nervous system: Alert and oriented. No focal neurological deficits. Extremities: no CCE.  Data Reviewed: Basic Metabolic Panel: Recent Labs  Lab 10/01/17 1643 10/02/17 0534 10/03/17 0607  NA 132* 137 134*  K 3.6 3.7 3.6  CL 96* 100* 98*  CO2 24 26 26   GLUCOSE 126* 142* 124*  BUN 18 19 29*  CREATININE 1.10 1.21 1.41*  CALCIUM 9.2 9.2 8.7*   Liver Function Tests: Recent Labs  Lab  10/02/17 0534 10/03/17 0607  AST 35 30  ALT 22 21  ALKPHOS 40 39  BILITOT 0.8 0.5  PROT 7.0 6.3*  ALBUMIN 3.6 3.1*   No results for input(s): LIPASE, AMYLASE in the last 168 hours. No results for input(s): AMMONIA in the last 168 hours. CBC: Recent Labs  Lab 10/01/17 1643 10/02/17 0534 10/03/17 0607  WBC 10.9* 20.7* 16.8*  NEUTROABS 7.7  --  13.9*  HGB 12.6* 12.6* 11.3*  HCT 37.6* 37.9* 34.9*  MCV 92.4 93.8 94.1  PLT 213 196 174   Cardiac Enzymes: No results for input(s): CKTOTAL, CKMB, CKMBINDEX, TROPONINI in the last 168 hours. CBG (last 3)  No results for input(s): GLUCAP in the last 72 hours. Recent Results (from the past 240 hour(s))  Urine culture     Status: Abnormal (Preliminary result)   Collection Time: 10/01/17  4:08 PM  Result Value Ref Range Status   Specimen Description URINE, CLEAN CATCH  Final   Special Requests NONE  Final   Culture (A)  Final    >=100,000 COLONIES/mL ENTEROCOCCUS FAECALIS SUSCEPTIBILITIES TO FOLLOW Performed at [facility], [address], [place], [place] [address]    Report Status PENDING  Incomplete  Culture, blood (Routine X 2) w Reflex to ID Panel     Status: None (Preliminary result)   Collection Time: 10/02/17 12:18 PM  Result Value Ref Range Status   Specimen Description BLOOD RIGHT HAND  Final   Special Requests   Final    BOTTLES DRAWN AEROBIC AND ANAEROBIC Blood Culture adequate volume   Culture NO GROWTH < 24 HOURS  Final   Report Status PENDING  Incomplete  Culture, blood (Routine X 2) w Reflex to ID Panel     Status: None (Preliminary result)   Collection Time: 10/02/17 12:18 PM  Result Value Ref Range Status   Specimen Description BLOOD RIGHT ARM  Final   Special Requests   Final    BOTTLES DRAWN AEROBIC AND ANAEROBIC Blood Culture results may not be optimal due to an excessive volume of blood received in culture bottles   Culture NO GROWTH < 24 HOURS  Final   Report Status PENDING  Incomplete      Studies: Dg Chest 2 View  Result Date: 10/01/2017 CLINICAL DATA:  Dry cough with fever and diarrhea EXAM: CHEST  2 VIEW COMPARISON:  08/11/2017 FINDINGS: The heart size and mediastinal contours are within normal limits. Both lungs are clear. Degenerative changes of the spine. IMPRESSION: No active cardiopulmonary disease. Electronically Signed   By: Donavan Foil M.D.   On: 10/01/2017 15:36   Ct Abdomen Pelvis W Contrast  Result Date: 10/01/2017 CLINICAL DATA:  Abdominal pain, fever and diarrhea EXAM: CT ABDOMEN AND PELVIS WITH CONTRAST TECHNIQUE: Multidetector CT imaging of the abdomen and pelvis was performed using the standard protocol following bolus administration of intravenous contrast. CONTRAST:  178mL ISOVUE-300 IOPAMIDOL (ISOVUE-300)  INJECTION 61% COMPARISON:  None. FINDINGS: Lower chest: Lung bases demonstrate no acute consolidation or effusion. Normal heart size. Fluid-filled distal esophageal hiatal hernia Hepatobiliary: Subcentimeter hypodense lesions in the dome of the liver too small to further characterize. No calcified gallstones or biliary dilatation Pancreas: Unremarkable. No pancreatic ductal dilatation or surrounding inflammatory changes. Spleen: Normal in size without focal abnormality. Adrenals/Urinary Tract: Adrenal glands are within normal limits. Cysts within the bilateral kidneys. Large 23 mm stone mid pole left kidney. There are multiple stones within both kidneys., the largest on the right measures 6 mm and is visible in the mid to lower pole. 16 mm intermediate density lesion upper pole left kidney. Mild to moderate right hydronephrosis and mild left hydronephrosis. Both ureters are dilated. There is irregular wall thickening of the bladder with mild diverticula. Nonspecific left greater than right perinephric fat stranding. Stomach/Bowel: Stomach is within normal limits. Appendix appears normal. No evidence of bowel wall thickening, distention, or inflammatory changes.  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 No aneurysmal dilatation. No significantly enlarged lymph nodes Reproductive: Post treatment changes of the prostate with multiple seeds present. Other: Tiny fat in the right inguinal canal. Negative for free air or free fluid mild edema in the perirectal fat Musculoskeletal: Nonspecific small foci of sclerosis L2 and T11 IMPRESSION: 1. Negative for bowel obstruction or bowel wall thickening 2. Irregular wall thickening of the bladder with multiple diverticula, query cystitis. There is bilateral hydronephrosis and hydroureter but no obstructing stone is evident. There are calcified stones within both kidneys, including a large 23 mm stone in left kidney. Nonspecific left greater than right perinephric fat stranding, could consider pyelonephritis in the appropriate clinical setting 3. Indeterminate 16 mm lesion upper pole left kidney. When the patient is clinically stable and able to follow directions and hold their breath (preferably as an outpatient) further evaluation with dedicated abdominal MRI should be considered. Electronically Signed   By: Donavan Foil M.D.   On: 10/01/2017 19:25   US Renal  Result Date: 10/03/2017 CLINICAL DATA:  Hydronephrosis, pyelonephritis EXAM: RENAL / URINARY TRACT ULTRASOUND COMPLETE COMPARISON:  CT abdomen 10/01/2017.  Ultrasound 03/22/2017 FINDINGS: Right Kidney: Length: 12.0 cm. Small nonobstructing renal calculi on the right best seen on C. Echogenicity within normal limits. No mass or hydronephrosis visualized. Left Kidney: Length: 13 cm. 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 Upper pole obstruction and hydronephrosis. Renal pelvis not well seen due to shadowing stone. Left lateral renal cyst 3.1 cm Bladder: Foley catheter.  Urinary bladder empty.  Bladder wall thickening. 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 Small nonobstructing calculi in the right kidney Foley catheter.  Bladder empty.  Bladder wall thickening noted. Electronically Signed   By: Franchot Gallo M.D.   On: 10/03/2017 11:16   Scheduled Meds: . acetaminophen  650 mg Rectal Q6H   Or  . acetaminophen  650 mg Oral Q6H  . enoxaparin (LOVENOX) injection  40 mg Subcutaneous Q24H  . ketorolac  30 mg Intravenous Q6H  . simvastatin  40 mg Oral QHS  . sodium chloride flush  3 mL Intravenous Q12H  . tamsulosin  0.4 mg Oral BID   Continuous Infusions: . sodium chloride 250 mL (10/01/17 2345)  . levofloxacin (LEVAQUIN) IV Stopped (10/02/17 1459)   Active Problems:   UTI (urinary tract infection)   Prostate cancer ([place])   Acute urinary retention   Chemotherapy-induced neuropathy ([place])  Hydronephrosis  Time spent:   Irwin Brakeman, MD, FAAFP [facility] Pager [phone]  If 7PM-7AM, please contact night-coverage [email or web address] Password [email or web address] 10/03/2017, 12:14 PM    LOS: 2 days

## 2017-10-04 LAB — COMPREHENSIVE METABOLIC PANEL
ALK PHOS: 53 U/L (ref 38–126)
ALT: 30 U/L (ref 17–63)
AST: 45 U/L — AB (ref 15–41)
Albumin: 2.9 g/dL — ABNORMAL LOW (ref 3.5–5.0)
Anion gap: 8 (ref 5–15)
BILIRUBIN TOTAL: 0.8 mg/dL (ref 0.3–1.2)
BUN: 32 mg/dL — AB (ref 6–20)
CALCIUM: 8.6 mg/dL — AB (ref 8.9–10.3)
CO2: 23 mmol/L (ref 22–32)
CREATININE: 1.15 mg/dL (ref 0.61–1.24)
Chloride: 107 mmol/L (ref 101–111)
GFR calc non Af Amer: 60 mL/min — ABNORMAL LOW (ref >60)
Glucose, Bld: 130 mg/dL — ABNORMAL HIGH (ref 65–99)
Potassium: 3.7 mmol/L (ref 3.5–5.1)
Sodium: 138 mmol/L (ref 135–145)
Total Protein: 6.3 g/dL — ABNORMAL LOW (ref 6.5–8.1)

## 2017-10-04 LAB — CBC WITH DIFFERENTIAL/PLATELET
BASOS ABS: 0 10*3/uL (ref 0.0–0.1)
Basophils Relative: 0 %
EOS PCT: 0 %
Eosinophils Absolute: 0 10*3/uL (ref 0.0–0.7)
HEMATOCRIT: 35.4 % — AB (ref 39.0–52.0)
HEMOGLOBIN: 11.2 g/dL — AB (ref 13.0–17.0)
LYMPHS ABS: 1.3 10*3/uL (ref 0.7–4.0)
LYMPHS PCT: 9 %
MCH: 29.5 pg (ref 26.0–34.0)
MCHC: 31.6 g/dL (ref 30.0–36.0)
MCV: 93.2 fL (ref 78.0–100.0)
Monocytes Absolute: 1.3 10*3/uL — ABNORMAL HIGH (ref 0.1–1.0)
Monocytes Relative: 10 %
NEUTROS ABS: 11 10*3/uL — AB (ref 1.7–7.7)
NEUTROS PCT: 81 %
Platelets: 171 10*3/uL (ref 150–400)
RBC: 3.8 MIL/uL — AB (ref 4.22–5.81)
RDW: 14.1 % (ref 11.5–15.5)
WBC: 13.6 10*3/uL — AB (ref 4.0–10.5)

## 2017-10-04 LAB — URINE CULTURE

## 2017-10-04 NOTE — Discharge Summary (Signed)
Physician Discharge Summary  TOMISLAV MICALE RCV:[id number] DOB: 05/03/1941 DOA: 10/01/2017  PCP: Glenda Chroman, MD Urologist: Dr. Tresa Endo, Dr. Biagio Borg Heme/Onc: Dr. Nunzio Cobbs  Admit date: 10/01/2017 Discharge date: 10/04/2017  Admitted From: Home  Disposition: TRANSFER TO [place] LEVEL CARE  Discharge Condition: STABLE   CODE STATUS: FULL    Brief Hospitalization Summary: Please see all hospital notes, images, labs for full details of the hospitalization. Brian Love  is a 77 y.o. male, with history of prostate cancer status post four  cycles of chemotherapy, radiation treatment, brachytherapy with radiation seeds who came to hospital with complaints of fever. Patient says that he was self catheterizing  himself at 10 PM every night but still was urinating 4 to 5 times at night so he stopped self catheterization around Christmas time.  Patient states that he usually gets 4 ounces when he urinates but 8 ounces usually when he self cath himself. Today when he self cath" 12 ounces of urine. He denies dysuria. Complains of left lower quadrant pain.  Denies chest pain or shortness of breath.  No nausea vomiting or diarrhea.  Patient's last chemotherapy was on November 8. He is currently off chemotherapy as it caused leg swelling.  In the ED, CT of the abdomen and pelvis showed bladder changes consistent with cystitis, PYELONEPHRITIS and bilateral hydronephrosis (mild). Patient empirically started on ceftriaxone.  urine culture obtained.   1. UTI / pyelonephritis / cystitis - urine culture pending, IV ceftriaxone given on admission, given bump in WBC, changed antibiotic to levofloxacin and clinically has been improving but continues to spike high fever.  Urology consult appreciated.  Urology recommended repeating US renal to make sure hydronephrosis is improving with catheter placement. There is improvement with hydronephrosis on right but contines to have  left upper pole obstruction and hydronephrosis.  I called and discussed with Dr. Jeffie Pollock who recommended transfer to [facility] for percutaneous nephrostomy placement.  I called and spoke with Dr. Caryl Comes and Dr. Kathreen Devoid who accepted patient to be transferred to [facility] for percutaneous nephrostomy placement and ongoing medical care.  Made arrangements for films to be placed on a disk for transfer with the patient.   Continue to treat him medically for now.  He is nontoxic appearing and vitals are improving.   2. Fever - secondary to UTI and likely left obstruction / hydronephrosis - treating symptomatically with tyleno.  Transfer to [facility] for percutaneous nephrostomy placement.  Follow blood cultures.  3. Bilateral hydronephrosis - suspect from urinary retention, foley placed, urology consult requested.  US renal ordered to follow up.  4. Leukocytosis - trending down now, following.    5. Sepsis secondary to UTI - continue supportive care.   6. Prostate cancer - s/p radiation/chemotherapy, followed by [facility] urology/oncology.  7. Lesion upper pole left kidney - CT scan showed indeterminate 16 mm lesion upper pole left kidney, to be followed up with MRI when medically stable.  8. Nephrolithiasis - Pt being transferred to [facility] for ongoing care and management.   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[facility])   Hydronephrosis  Discharge Instructions: Discharge Instructions    Increase activity slowly   Complete by:  As directed  Allergies as of 10/04/2017   No Known Allergies     Medication List    STOP taking these medications   CO Q 10 PO   furosemide 20 MG tablet Commonly known as:  LASIX   leuprolide (6 Month) 45  MG injection Commonly known as:  ELIGARD   ondansetron 8 MG tablet Commonly known as:  ZOFRAN   potassium chloride SA 20 MEQ tablet Commonly known as:  K-DUR,KLOR-CON   simvastatin 40 MG tablet Commonly known as:  ZOCOR   tamsulosin 0.4 MG Caps capsule Commonly known as:  FLOMAX       No Known Allergies Allergies as of 10/04/2017   No Known Allergies     Medication List    STOP taking these medications   CO Q 10 PO   furosemide 20 MG tablet Commonly known as:  LASIX   leuprolide (6 Month) 45 MG injection Commonly known as:  ELIGARD   ondansetron 8 MG tablet Commonly known as:  ZOFRAN   potassium chloride SA 20 MEQ tablet Commonly known as:  K-DUR,KLOR-CON   simvastatin 40 MG tablet Commonly known as:  ZOCOR   tamsulosin 0.4 MG Caps capsule Commonly known as:  FLOMAX       Procedures/Studies: Dg Chest 2 View  Result Date: 10/01/2017 CLINICAL DATA:  Dry cough with fever and diarrhea EXAM: CHEST  2 VIEW COMPARISON:  08/11/2017 FINDINGS: The heart size and mediastinal contours are within normal limits. Both lungs are clear. Degenerative changes of the spine. IMPRESSION: No active cardiopulmonary disease. Electronically Signed   By: Donavan Foil M.D.   On: 10/01/2017 15:36   Ct Abdomen Pelvis W Contrast  Result Date: 10/01/2017 CLINICAL DATA:  Abdominal pain, fever and diarrhea EXAM: CT ABDOMEN AND PELVIS WITH CONTRAST TECHNIQUE: Multidetector CT imaging of the abdomen and pelvis was performed using the standard protocol following bolus administration of intravenous contrast. CONTRAST:  186mL ISOVUE-300 IOPAMIDOL (ISOVUE-300) INJECTION 61% COMPARISON:  None. FINDINGS: Lower chest: Lung bases demonstrate no acute consolidation or effusion. Normal heart size. Fluid-filled distal esophageal hiatal hernia Hepatobiliary: Subcentimeter hypodense lesions in the dome of the liver too small to further characterize. No calcified gallstones or biliary dilatation Pancreas:  Unremarkable. No pancreatic ductal dilatation or surrounding inflammatory changes. Spleen: Normal in size without focal abnormality. Adrenals/Urinary Tract: Adrenal glands are within normal limits. Cysts within the bilateral kidneys. Large 23 mm stone mid pole left kidney. There are multiple stones within both kidneys., the largest on the right measures 6 mm and is visible in the mid to lower pole. 16 mm intermediate density lesion upper pole left kidney. Mild to moderate right hydronephrosis and mild left hydronephrosis. Both ureters are dilated. There is irregular wall thickening of the bladder with mild diverticula. Nonspecific left greater than right perinephric fat stranding. Stomach/Bowel: Stomach is within normal limits. Appendix appears normal. No evidence of bowel wall thickening, distention, or inflammatory changes. 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 No aneurysmal dilatation. No significantly enlarged lymph nodes Reproductive: Post treatment changes of the prostate with multiple seeds present. Other: Tiny fat in the right inguinal canal. Negative for free air or free fluid mild edema in the perirectal fat Musculoskeletal: Nonspecific small foci of sclerosis L2 and T11 IMPRESSION: 1. Negative for bowel obstruction or bowel wall thickening 2. Irregular wall thickening of the bladder with multiple diverticula, query cystitis. There is bilateral hydronephrosis and hydroureter but no obstructing stone is evident. There are calcified stones within  both kidneys, including a large 23 mm stone in left kidney. Nonspecific left greater than right perinephric fat stranding, could consider pyelonephritis in the appropriate clinical setting 3. Indeterminate 16 mm lesion upper pole left kidney. When the patient is clinically stable and able to follow directions and hold their breath (preferably as an outpatient) further evaluation with  dedicated abdominal MRI should be considered. Electronically Signed   By: Donavan Foil M.D.   On: 10/01/2017 19:25   US Renal  Result Date: 10/03/2017 CLINICAL DATA:  Hydronephrosis, pyelonephritis EXAM: RENAL / URINARY TRACT ULTRASOUND COMPLETE COMPARISON:  CT abdomen 10/01/2017.  Ultrasound 03/22/2017 FINDINGS: Right Kidney: Length: 12.0 cm. Small nonobstructing renal calculi on the right best seen on C. Echogenicity within normal limits. No mass or hydronephrosis visualized. Left Kidney: Length: 13 cm. 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 Upper pole obstruction and hydronephrosis. Renal pelvis not well seen due to shadowing stone. Left lateral renal cyst 3.1 cm Bladder: Foley catheter.  Urinary bladder empty.  Bladder wall thickening. 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 Small nonobstructing calculi in the right kidney Foley catheter.  Bladder empty.  Bladder wall thickening noted. Electronically Signed   By: Franchot Gallo M.D.   On: 10/03/2017 11:16      Subjective: Pt says that he is feeling better.  He is eating and drinking.    Discharge Exam: Vitals:   10/04/17 0430 10/04/17 0600  BP: 140/73   Pulse: 87   Resp: (!) 24   Temp:  100.1 F (37.8 C)  SpO2: 93%    Vitals:   10/04/17 0049 10/04/17 0421 10/04/17 0430 10/04/17 0600  BP:   140/73   Pulse:   87   Resp:   (!) 24   Temp: (!) 97.5 F (36.4 C) (!) 101 F (38.3 C)  100.1 F (37.8 C)  TempSrc: Oral     SpO2:   93%   Weight:      Height:       General: Pt is alert, awake, not in acute distress Cardiovascular: RRR, S1/S2 +, no rubs, no gallops Respiratory: CTA bilaterally, no wheezing, no rhonchi Abdominal: Soft, NT, ND, bowel sounds + Extremities: no edema, no cyanosis   The results of significant diagnostics from this hospitalization (including imaging, microbiology, ancillary and laboratory) are listed below for reference.     Microbiology: Recent Results (from the past 240 hour(s))  Urine culture     Status: Abnormal   Collection Time: 10/01/17  4:08 PM  Result Value Ref Range Status   Specimen Description URINE, CLEAN CATCH  Final   Special Requests NONE  Final   Culture >=100,000 COLONIES/mL ENTEROCOCCUS FAECALIS (A)  Final   Report Status 10/04/2017 FINAL  Final   Organism ID, Bacteria ENTEROCOCCUS FAECALIS (A)  Final      Susceptibility   Enterococcus faecalis - MIC*    AMPICILLIN <=2 SENSITIVE Sensitive     LEVOFLOXACIN 1 SENSITIVE Sensitive     NITROFURANTOIN <=16 SENSITIVE Sensitive     VANCOMYCIN 1 SENSITIVE Sensitive     * >=100,000 COLONIES/mL ENTEROCOCCUS FAECALIS  Culture, blood (Routine X 2) w Reflex to ID Panel     Status: None (Preliminary result)   Collection Time: 10/02/17 12:18 PM  Result Value Ref Range Status   Specimen Description BLOOD RIGHT HAND  Final   Special Requests   Final    BOTTLES DRAWN AEROBIC AND ANAEROBIC Blood Culture adequate volume  Culture NO GROWTH 2 DAYS  Final   Report Status PENDING  Incomplete  Culture, blood (Routine X 2) w Reflex to ID Panel     Status: None (Preliminary result)   Collection Time: 10/02/17 12:18 PM  Result Value Ref Range Status   Specimen Description BLOOD RIGHT ARM  Final   Special Requests   Final    BOTTLES DRAWN AEROBIC AND ANAEROBIC Blood Culture results may not be optimal due to an excessive volume of blood received in culture bottles   Culture NO GROWTH 2 DAYS  Final   Report Status PENDING  Incomplete     Labs: BNP (last 3 results) No results for input(s): BNP in the last 8760 hours. Basic Metabolic Panel: Recent Labs  Lab 10/01/17 1643 10/02/17 0534 10/03/17 0607 10/04/17 0456  NA 132* 137 134* 138  K 3.6 3.7 3.6 3.7  CL 96* 100* 98* 107  CO2 24 26 26 23   GLUCOSE 126* 142* 124* 130*  BUN 18 19 29* 32*  CREATININE 1.10 1.21 1.41* 1.15  CALCIUM 9.2 9.2 8.7* 8.6*   Liver Function Tests: Recent Labs  Lab  10/02/17 0534 10/03/17 0607 10/04/17 0456  AST 35 30 45*  ALT 22 21 30   ALKPHOS 40 39 53  BILITOT 0.8 0.5 0.8  PROT 7.0 6.3* 6.3*  ALBUMIN 3.6 3.1* 2.9*   No results for input(s): LIPASE, AMYLASE in the last 168 hours. No results for input(s): AMMONIA in the last 168 hours. CBC: Recent Labs  Lab 10/01/17 1643 10/02/17 0534 10/03/17 0607 10/04/17 0456  WBC 10.9* 20.7* 16.8* 13.6*  NEUTROABS 7.7  --  13.9* 11.0*  HGB 12.6* 12.6* 11.3* 11.2*  HCT 37.6* 37.9* 34.9* 35.4*  MCV 92.4 93.8 94.1 93.2  PLT 213 196 174 171   Cardiac Enzymes: No results for input(s): CKTOTAL, CKMB, CKMBINDEX, TROPONINI in the last 168 hours. BNP: Invalid input(s): POCBNP CBG: No results for input(s): GLUCAP in the last 168 hours. D-Dimer No results for input(s): DDIMER in the last 72 hours. Hgb A1c No results for input(s): HGBA1C in the last 72 hours. Lipid Profile No results for input(s): CHOL, HDL, LDLCALC, TRIG, CHOLHDL, LDLDIRECT in the last 72 hours. Thyroid function studies No results for input(s): TSH, T4TOTAL, T3FREE, THYROIDAB in the last 72 hours.  Invalid input(s): FREET3 Anemia work up No results for input(s): VITAMINB12, FOLATE, FERRITIN, TIBC, IRON, RETICCTPCT in the last 72 hours. Urinalysis    Component Value Date/Time   COLORURINE YELLOW 10/01/2017 1638   APPEARANCEUR HAZY (A) 10/01/2017 1638   LABSPEC 1.011 10/01/2017 1638   PHURINE 7.0 10/01/2017 1638   GLUCOSEU NEGATIVE 10/01/2017 1638   HGBUR LARGE (A) 10/01/2017 1638   BILIRUBINUR NEGATIVE 10/01/2017 1638   KETONESUR NEGATIVE 10/01/2017 1638   PROTEINUR 30 (A) 10/01/2017 1638   NITRITE NEGATIVE 10/01/2017 1638   LEUKOCYTESUR SMALL (A) 10/01/2017 1638   Sepsis Labs Invalid input(s): PROCALCITONIN,  WBC,  LACTICIDVEN Microbiology Recent Results (from the past 240 hour(s))  Urine culture     Status: Abnormal   Collection Time: 10/01/17  4:08 PM  Result Value Ref Range Status   Specimen Description URINE, CLEAN  CATCH  Final   Special Requests NONE  Final   Culture >=100,000 COLONIES/mL ENTEROCOCCUS FAECALIS (A)  Final   Report Status 10/04/2017 FINAL  Final   Organism ID, Bacteria ENTEROCOCCUS FAECALIS (A)  Final      Susceptibility   Enterococcus faecalis - MIC*    AMPICILLIN <=2 SENSITIVE Sensitive  LEVOFLOXACIN 1 SENSITIVE Sensitive     NITROFURANTOIN <=16 SENSITIVE Sensitive     VANCOMYCIN 1 SENSITIVE Sensitive     * >=100,000 COLONIES/mL ENTEROCOCCUS FAECALIS  Culture, blood (Routine X 2) w Reflex to ID Panel     Status: None (Preliminary result)   Collection Time: 10/02/17 12:18 PM  Result Value Ref Range Status   Specimen Description BLOOD RIGHT HAND  Final   Special Requests   Final    BOTTLES DRAWN AEROBIC AND ANAEROBIC Blood Culture adequate volume   Culture NO GROWTH 2 DAYS  Final   Report Status PENDING  Incomplete  Culture, blood (Routine X 2) w Reflex to ID Panel     Status: None (Preliminary result)   Collection Time: 10/02/17 12:18 PM  Result Value Ref Range Status   Specimen Description BLOOD RIGHT ARM  Final   Special Requests   Final    BOTTLES DRAWN AEROBIC AND ANAEROBIC Blood Culture results may not be optimal due to an excessive volume of blood received in culture bottles   Culture NO GROWTH 2 DAYS  Final   Report Status PENDING  Incomplete   Time coordinating discharge: 35 mins  SIGNED:  Irwin Brakeman, MD  [facility] 10/04/2017, 11:43 AM Pager [phone]  If 7PM-7AM, please contact night-coverage [email or web address] Password [email or web address]

## 2017-10-04 NOTE — Progress Notes (Signed)
Patient states he "does not need the cardiac monitor and asked to have it removed". MD notified.

## 2017-10-04 NOTE — Discharge Instructions (Signed)
Acute Urinary Retention, Male Acute urinary retention is when you are unable to pee (urinate). Acute urinary retention is common in older men. Prostates can get bigger, which blocks the flow of pee. Follow these instructions at home:  Drink enough fluids to keep your pee clear or pale yellow.  If you are sent home with a tube that drains the bladder (catheter), there will be a drainage bag attached to it. There are two types of bags. One is big that you can wear at night without having to empty it. One is smaller and needs to be emptied more often. ? Keep the drainage bag empty. ? Keep the drainage bag lower than your catheter.  Only take medicine as told by your doctor. Contact a doctor if:  You have a low-grade fever.  You have spasms or you are leaking pee when you have spasms. Get help right away if:  You have chills or a fever.  Your catheter stops draining pee.  Your catheter falls out.  You have increased bleeding that does not stop after you have rested and increased the amount of fluids you had been drinking. This information is not intended to replace advice given to you by your health care provider. Make sure you discuss any questions you have with your health care provider. Document Released: 02/20/2008 Document Revised: 02/09/2016 Document Reviewed: 02/12/2013 Elsevier Interactive Patient Education  2017 Else[employer] Inc.     Hydronephrosis Hydronephrosis is the enlargement of a kidney due to a blockage that stops urine from flowing out of the body. What are the causes? Common causes of this condition include:  A birth (congenital) defect of the kidney.  A congenital defect of the tube through which urine travels (ureter).  Kidney stones.  An enlarged prostate gland.  A tumor.  Cancer of the prostate, bladder, uterus, ovary, or colon.  A blood clot.  What are the signs or symptoms? Symptoms of this condition include:  Pain or discomfort in your side  (flank).  Swelling of the abdomen.  Pain in the abdomen.  Nausea and vomiting.  Fever.  Pain while passing urine.  Feeling of urgency to urinate.  Frequent urination.  Infection of the urinary tract.  In some cases, there are no symptoms. How is this diagnosed? This condition may be diagnosed with:  A medical history.  A physical exam.  Blood and urine tests to check kidney function.  Imaging tests, such as an X-ray, ultrasound, CT scan, or MRI.  A test in which a rigid or flexible telescope (cystoscope) is used to view the site of the blockage.  How is this treated? Treatment for this condition depends on where the blockage is located, how long it has been there, and what caused it. The goal of treatment is to remove the blockage. Treatment options include:  A procedure to put in a soft tube to help drain urine.  Antibiotic medicines to treat or prevent infection.  Shock-wave therapy (lithotripsy) to help eliminate kidney stones.  Follow these instructions at home:  Get lots of rest.  Drink enough fluid to keep your urine clear or pale yellow.  If you have a drain in, follow your health care provider's instructions about how to care for it.  Take medicines only as directed by your health care provider.  If you were prescribed an antibiotic medicine, finish all of it even if you start to feel better.  Keep all follow-up visits as directed by your health care provider. This is  important. Contact a health care provider if:  You continue to have symptoms after treatment.  You develop new symptoms.  You have a problem with a drainage device.  Your urine becomes cloudy or bloody.  You have a fever. Get help right away if:  You have severe flank or abdominal pain.  You develop vomiting and are unable to keep fluids down. This information is not intended to replace advice given to you by your health care provider. Make sure you discuss any questions you  have with your health care provider. Document Released: 07/01/2007 Document Revised: 02/09/2016 Document Reviewed: 08/30/2014 Elsevier Interactive Patient Education  2018 [employer].   Indwelling Urinary Catheter Insertion, Care After This sheet gives you information about how to care for yourself after your procedure. Your health care provider may also give you more specific instructions. If you have problems or questions, contact your health care provider. What can I expect after the procedure? After the procedure, it is common to have:  Slight discomfort around your urethra where the catheter enters your body.  Follow these instructions at home:  Keep the drainage bag at or below the level of your bladder. Doing this ensures that urine can only drain out, not back into your body.  Secure the catheter tubing and drainage bag to your leg or thigh to keep it from moving.  Check the catheter tubing regularly to make sure there are no kinks or blockages.  Take showers daily to keep the catheter clean. Do not take a bath.  Do not pull on your catheter or try to remove it.  Disconnect the tubing and drainage bag as little as possible.  Empty the drainage bag every 2-4 hours, or more often if needed. Do not let the bag get completely full.  Wash your hands with soap and water before and after touching the catheter, tubing, or drainage bag.  Do not let the drainage bag or catheter tubing touch the floor.  Drink enough fluids to keep your urine clear or pale yellow, or as told by your health care provider. Contact a health care provider if:  Urine stops flowing into the drainage bag.  You feel pain or pressure in the bladder area.  You have back pain.  Your catheter gets clogged.  Your catheter starts to leak.  Your urine looks cloudy.  Your drainage bag or tubing looks dirty.  You notice a bad smell when emptying your drainage bag. Get help right away if:  You have a  fever or chills.  You have severe pain in your back or your lower abdomen.  You have warmth, redness, swelling, or pain in the urethra area.  You notice blood in your urine.  Your catheter gets pulled out. Summary  Do not pull on your catheter or try to remove it.  Keep the drainage bag at or below the level of your bladder, but do not let the drainage bag or catheter tubing touch the floor.  Wash your hands with soap and water before and after touching the catheter, tubing, or drainage bag.  Contact your health care provider if you have a fever, chills, or any other signs of infection. This information is not intended to replace advice given to you by your health care provider. Make sure you discuss any questions you have with your health care provider. Document Released: 10/13/2016 Document Revised: 10/13/2016 Document Reviewed: 10/13/2016 Elsevier Interactive Patient Education  2018 [employer].   Pyelonephritis, Adult Pyelonephritis is a  kidney infection. The kidneys are organs that help clean your blood by moving waste out of your blood and into your pee (urine). This infection can happen quickly, or it can last for a long time. In most cases, it clears up with treatment and does not cause other problems. Follow these instructions at home: Medicines  Take over-the-counter and prescription medicines only as told by your doctor.  Take your antibiotic medicine as told by your doctor. Do not stop taking the medicine even if you start to feel better. General instructions  Drink enough fluid to keep your pee clear or pale yellow.  Avoid caffeine, tea, and carbonated drinks.  Pee (urinate) often. Avoid holding in pee for long periods of time.  Pee before and after sex.  After pooping (having a bowel movement), women should wipe from front to back. Use each tissue only once.  Keep all follow-up visits as told by your doctor. This is important. Contact a doctor if:  You do  not feel better after 2 days.  Your symptoms get worse.  You have a fever. Get help right away if:  You cannot take your medicine or drink fluids as told.  You have chills and shaking.  You throw up (vomit).  You have very bad pain in your side (flank) or back.  You feel very weak or you pass out (faint). This information is not intended to replace advice given to you by your health care provider. Make sure you discuss any questions you have with your health care provider. Document Released: 10/11/2004 Document Revised: 02/09/2016 Document Reviewed: 12/27/2014 Elsevier Interactive Patient Education  2018 [place] Prescribed an Antibiotic in the Hospital for an Infection You've Been Prescribed an Antibiotic in the Hospital for an Infection Your healthcare team has decided you or your loved one has an infection that requires antibiotics, or needs antibiotics to prevent an infection in certain circumstances, such as before surgery. Antibiotics save lives, and they are critical tools for treating a number of common infections, such as pneumonia, and for life-threatening conditions such as sepsis. They need to be used properly because they can cause side effects and lead to antibiotic resistance. But when antibiotics are needed, the benefits outweigh the risks of side effects or antibiotic resistance. There are some important things you should know about your antibiotic treatment.  Your healthcare team may run tests before you start taking an antibiotic. ? Your team may take samples (from your blood, urine or other areas) to run tests to look for bacteria. These tests can be important to determine if you need an antibiotic at all and, if you do, which antibiotic will work best.  After a few days of treatment, your healthcare team might change, or even stop, your antibiotic. ? While they are working to find out what is making you sick, your team has started you on an  antibiotic ? If test results show a different antibiotic would be better to treat your infection, they will change your antibiotic. ? Your team may review antibiotic therapy 48 to 72 hours after it is started based on your clinical condition and microbiology culture results, and stop or change antibiotic orders as needed--an important step in your care ? In some cases, once your team has more information, they might decide that you do not need an antibiotic at all. They may find out that you dont have an infection, or that the antibiotic youre taking wont work against your infection.  For example, an infection caused by a virus cant be treated with antibiotics. Staying on an antibiotic when you dont need it wont help you and the side effects could still hurt you.  You may experience side effects from your antibiotic. ? Like all medications, antibiotics have side effects. Some of these can be serious. ? Let your healthcare team know if you have any known allergies when you are admitted to the hospital. ? Common side effects of antibiotics can include rash, dizziness, nausea, yeast infections, and diarrhea. ? The most serious side effects include Clostridium difficile infection (also called C. difficile or C. diff) and life-threatening allergic reactions. C. difficile causes diarrhea that can lead to severe colon damage and death ? Diarrhea caused by C. difficile can be serious and must be recognized and treated quickly. When you are taking an antibiotic and you develop diarrhea, let your healthcare team know immediately. ? The risk of getting C. difficile diarrhea can last for up to several months even after you are no longer getting antibiotics. You should let your healthcare team know if you develop diarrhea even after you are no longer getting an antibiotic.  As a patient or caregiver, it is important to understand your or your loved one's antibiotic treatment. It is especially important for  caregivers to speak up when patients can't speak for themselves. Here are some important questions to ask your healthcare team. ? What infection is this antibiotic treating and how do you know I have that infection? ? Is the antibiotic being prescribed the most targeted to treat the infection while causing the least side effects? ? What side effects might occur from this antibiotic? ? How long will I need to take this antibiotic? ? Is it safe to take this antibiotic with other medications or supplements (e.g., vitamins) that I am taking? ? Are there any special directions I need to know about taking this antibiotic? For example, should I take it with food? ? How will I be monitored to know whether my infection is responding to the antibiotic? ? What tests may help to make sure the right antibiotic is prescribed for me?  Remember, antibiotics are life-saving drugs and they need to be used properly. If you have any questions about your antibiotics, please talk to your healthcare team. To learn more about antibiotic prescribing and use, visit [employer]. CDC - Be Antibiotics Aware: Smart Use, Best Care, 12/2016. This information is not intended to replace advice given to you by your health care provider. Make sure you discuss any questions you have with your health care provider. Document Released: 01/10/2017 Document Revised: 01/10/2017 Document Reviewed: 01/10/2017 Elsevier Interactive Patient Education  [employer].

## 2017-10-04 NOTE — Progress Notes (Signed)
Patient bed available at [facility] Room: [phone]. [facility] in route to receive patient.

## 2017-10-04 NOTE — Care Management Important Message (Signed)
Important Message  Patient Details  Name: Brian Love MRN: [id number] Date of Birth: Mar 23, 1941   Medicare Important Message Given:  Yes    Sherald Barge, RN 10/04/2017, 12:34 PM

## 2017-10-04 NOTE — Progress Notes (Signed)
Central telemetry reported to this nurse that patient had a 7 beat run of SVT. MD notified.

## 2017-10-04 NOTE — Progress Notes (Signed)
Report called and Given to Barlow at [place].

## 2017-10-07 LAB — CULTURE, BLOOD (ROUTINE X 2)
CULTURE: NO GROWTH
CULTURE: NO GROWTH
SPECIAL REQUESTS: ADEQUATE

## 2019-04-23 IMAGING — US US RENAL
1 series · 14 of 25 positions shown · non-contrast
Comparison: CT abdomen 10/01/2017.  Ultrasound 03/22/2017

CLINICAL DATA: Hydronephrosis, pyelonephritis

EXAM:
RENAL / URINARY TRACT ULTRASOUND COMPLETE

[Series 1: us renal · 0.25mm/px · 14 of 67 slices shown]
[im 1/67]
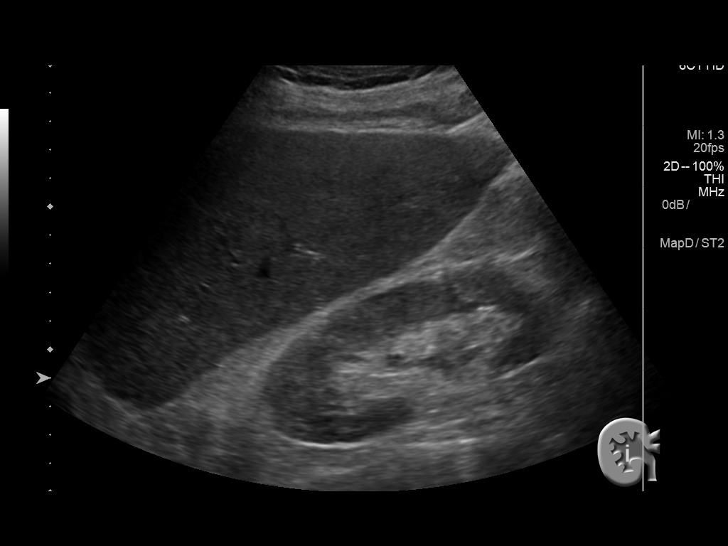
[im 6/67]
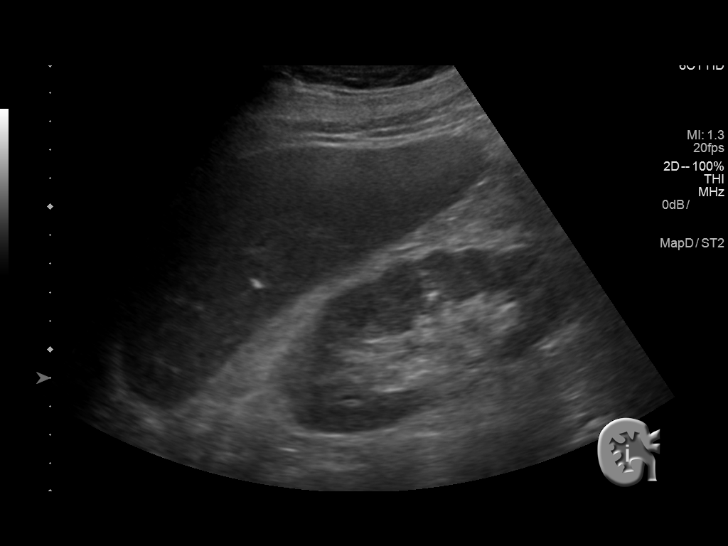
[im 12/67]
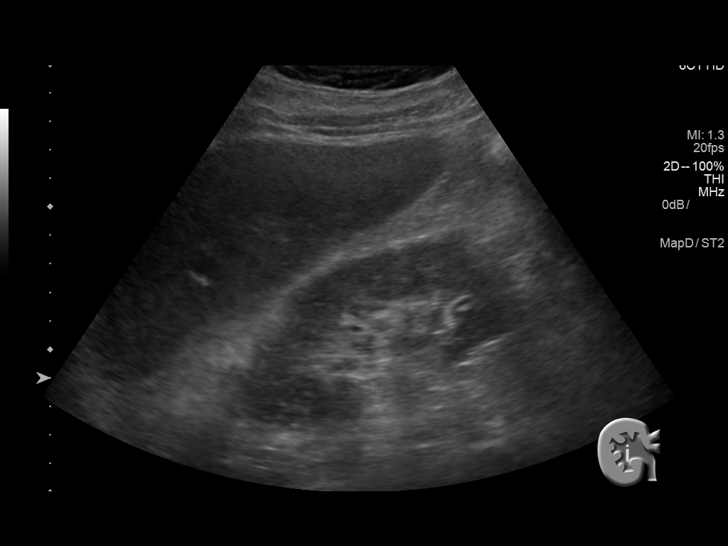
[im 17/67]
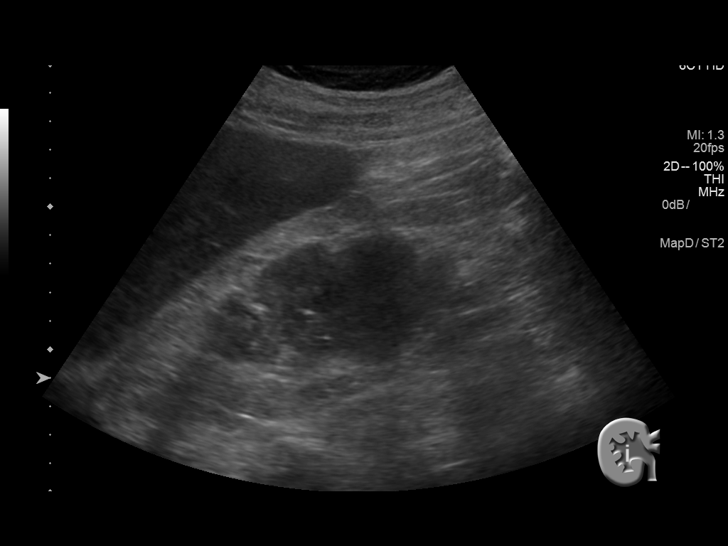
[im 23/67]
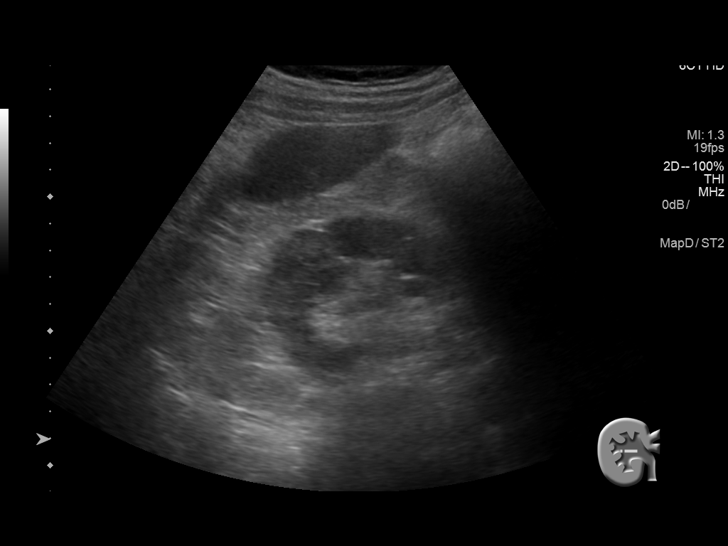
[im 25/67]
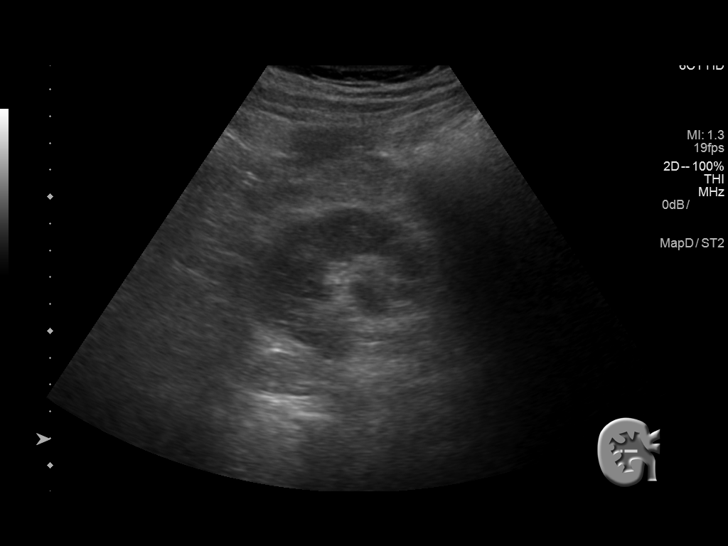
[im 31/67]
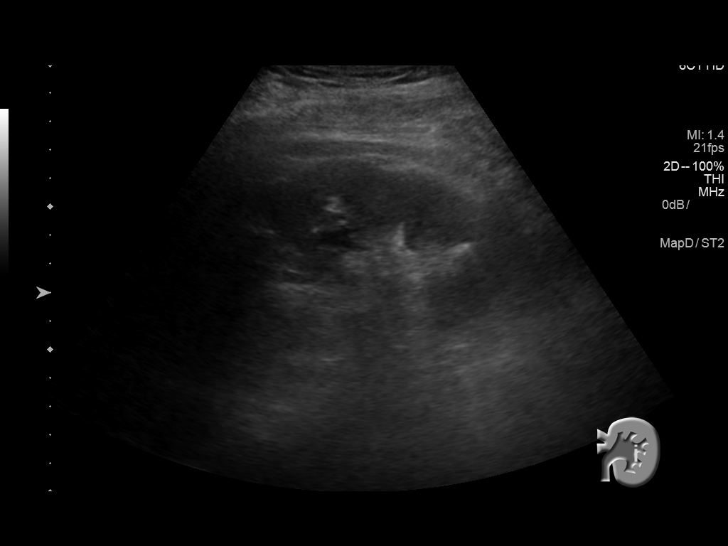
[im 36/67]
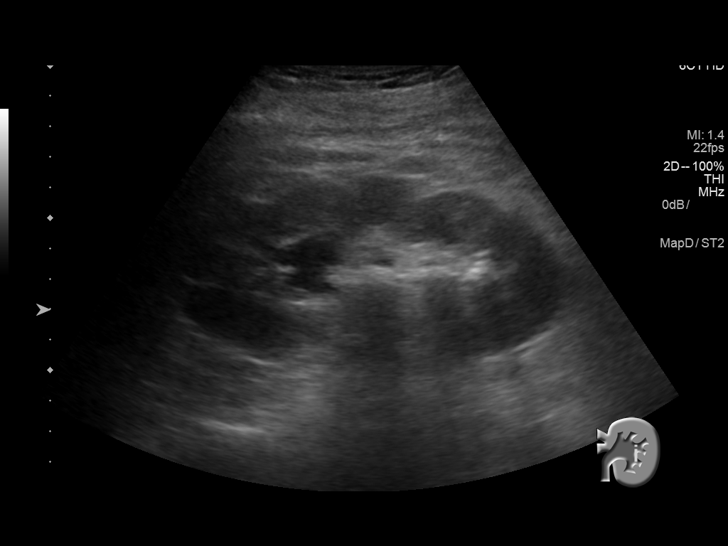
[im 42/67]
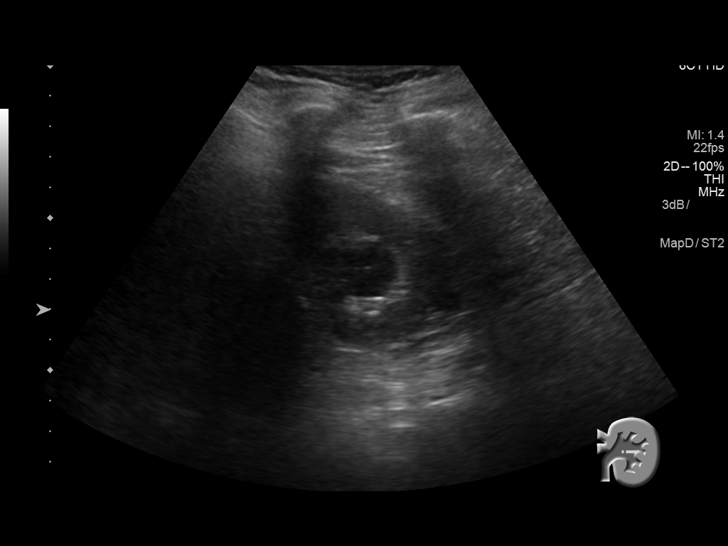
[im 45/67]
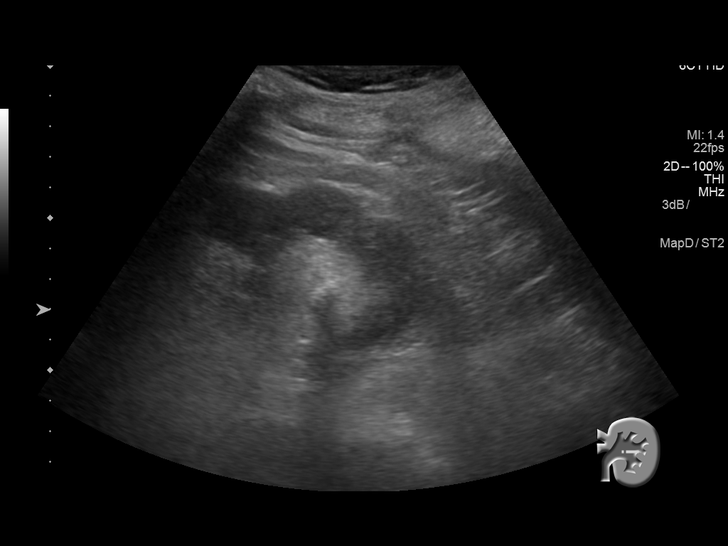
[im 50/67]
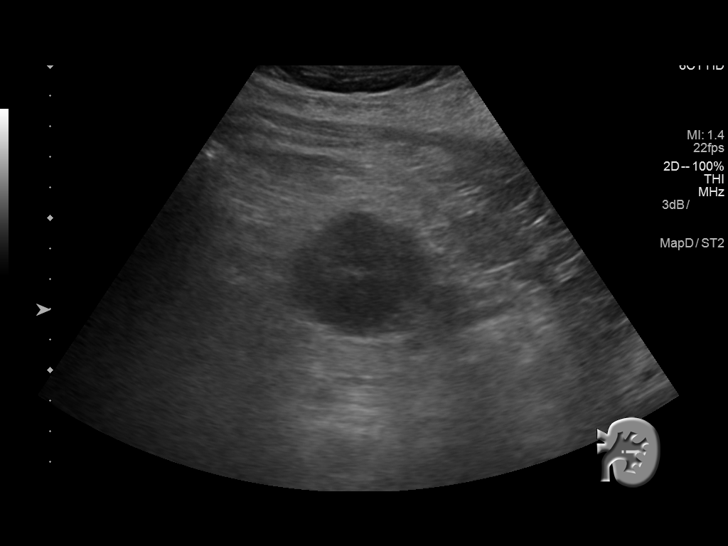
[im 56/67]
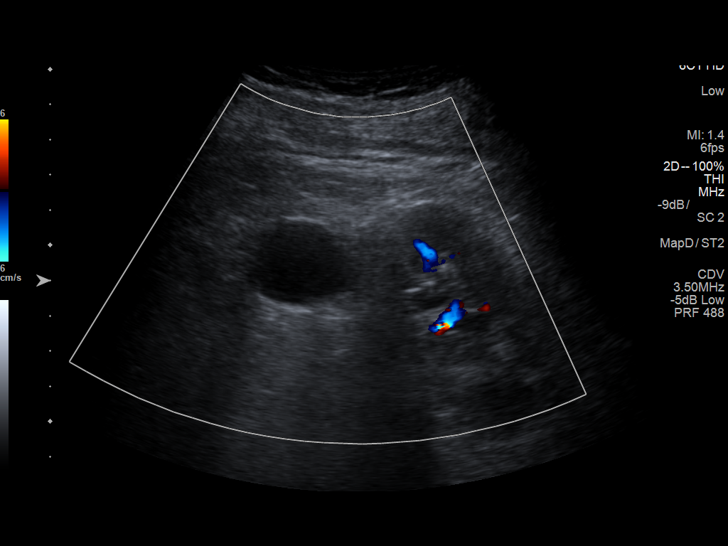
[im 61/67]
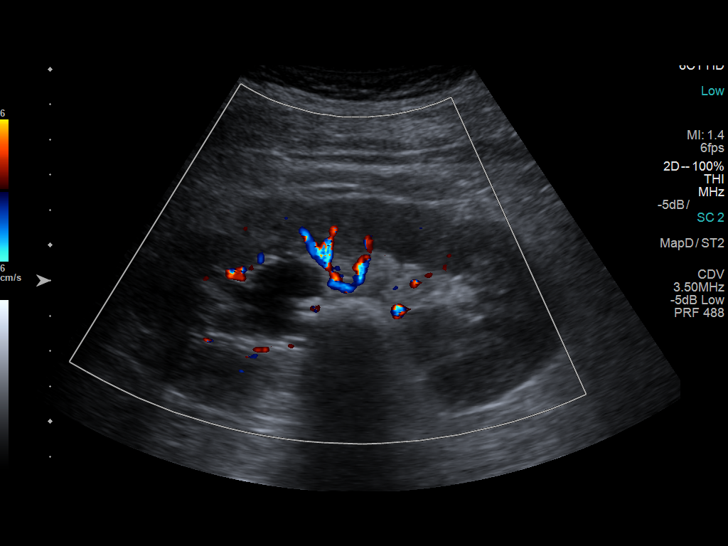
[im 67/67]
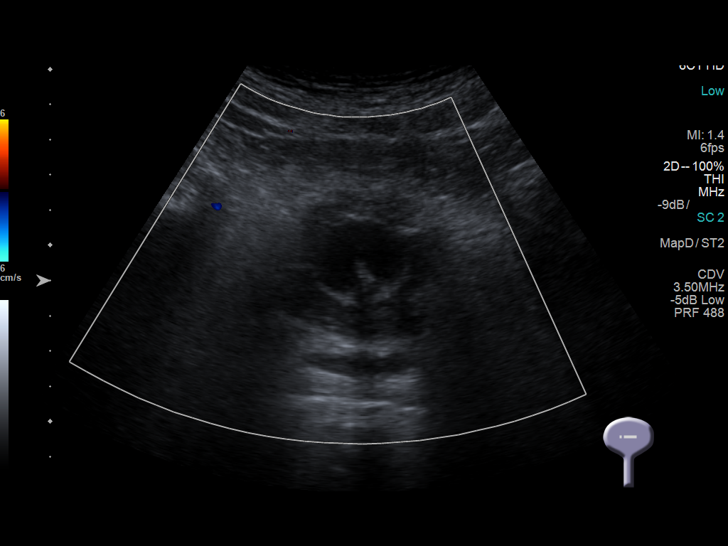

[14 of 25 positions shown; findings below may reference images not displayed]

FINDINGS: Right Kidney:

Length: 12.0 cm. Small nonobstructing renal calculi on the right
best seen on C. Echogenicity within normal limits. No mass or
hydronephrosis visualized.

Left Kidney:

Length: 13 cm. Partial staghorn calculus 2.3 cm. Upper pole
obstruction and hydronephrosis. Renal pelvis not well seen due to
shadowing stone. Left lateral renal cyst 3.1 cm

Bladder:

Foley catheter.  Urinary bladder empty.  Bladder wall thickening.
IMPRESSION: 2.3 cm partial staghorn calculus with left renal pelvis causing
obstruction of left upper pole.

Small nonobstructing calculi in the right kidney

Foley catheter.  Bladder empty.  Bladder wall thickening noted.

## 2019-09-29 DIAGNOSIS — D6869 Other thrombophilia: Secondary | ICD-10-CM | POA: Diagnosis not present

## 2019-09-29 DIAGNOSIS — R0789 Other chest pain: Secondary | ICD-10-CM | POA: Diagnosis not present

## 2019-09-29 DIAGNOSIS — K219 Gastro-esophageal reflux disease without esophagitis: Secondary | ICD-10-CM | POA: Diagnosis not present

## 2019-09-29 DIAGNOSIS — R079 Chest pain, unspecified: Secondary | ICD-10-CM | POA: Diagnosis not present

## 2019-09-29 DIAGNOSIS — R002 Palpitations: Secondary | ICD-10-CM | POA: Diagnosis not present

## 2019-09-29 DIAGNOSIS — Z299 Encounter for prophylactic measures, unspecified: Secondary | ICD-10-CM | POA: Diagnosis not present

## 2019-09-29 DIAGNOSIS — Z79899 Other long term (current) drug therapy: Secondary | ICD-10-CM | POA: Diagnosis not present

## 2019-09-30 DIAGNOSIS — R079 Chest pain, unspecified: Secondary | ICD-10-CM | POA: Diagnosis not present

## 2019-10-05 DIAGNOSIS — R079 Chest pain, unspecified: Secondary | ICD-10-CM | POA: Diagnosis not present

## 2019-10-05 DIAGNOSIS — R0789 Other chest pain: Secondary | ICD-10-CM | POA: Diagnosis not present

## 2019-10-06 DIAGNOSIS — G620 Drug-induced polyneuropathy: Secondary | ICD-10-CM | POA: Diagnosis not present

## 2019-10-06 DIAGNOSIS — Z192 Hormone resistant malignancy status: Secondary | ICD-10-CM | POA: Diagnosis not present

## 2019-10-08 DIAGNOSIS — R079 Chest pain, unspecified: Secondary | ICD-10-CM | POA: Diagnosis not present

## 2019-10-08 DIAGNOSIS — Z299 Encounter for prophylactic measures, unspecified: Secondary | ICD-10-CM | POA: Diagnosis not present

## 2019-10-08 DIAGNOSIS — I77810 Thoracic aortic ectasia: Secondary | ICD-10-CM | POA: Diagnosis not present

## 2019-10-08 DIAGNOSIS — I714 Abdominal aortic aneurysm, without rupture: Secondary | ICD-10-CM | POA: Diagnosis not present

## 2019-10-19 NOTE — Progress Notes (Deleted)
    Patient referred by Glenda Chroman, MD for ***  Subjective:   Brian Love, male    DOB: [birth year], 79 y.o.   MRN: [id number]  *** No chief complaint on file.   *** HPI  79 y.o. *** male with ***  *** Past Medical History:  Diagnosis Date  . High cholesterol   . Prostate cancer ([place])     *** Past Surgical History:  Procedure Laterality Date  . CATARACT EXTRACTION    . KIDNEY STONE SURGERY      *** Social History   Tobacco Use  Smoking Status Never Smoker  Smokeless Tobacco Never Used    Social History   Substance and Sexual Activity  Alcohol Use Yes   Comment: occasionally    *** No family history on file.  *** No current outpatient medications on file prior to visit.   No current facility-administered medications on file prior to visit.    Cardiovascular and other pertinent studies: Pt had a chest xray 09/30/2019 and an echo 10/05/2019  *** EKG ***/***/202***: *** 09/29/2019  *** Recent labs: 12/23/2018: Glucose 104, BUN/Cr 19/1.13. EGFR 62. Na/K 141/3.9. ***Rest of the CMP normal H/H14.8/42.0. MCV 94. Platelets 295 ***HbA1C ***% 06/08/2019: Chol 196, TG 83, HDL 53, LDL 128 ***TSH 2.34 normal   *** ROS      *** There were no vitals filed for this visit.   There is no height or weight on file to calculate BMI. There were no vitals filed for this visit.  *** Objective:   Physical Exam    ***     Assessment & Recommendations:   ***  ***     Thank you for referring the patient to [place]. Please feel free to contact with any questions.  Nigel Mormon, MD [facility] [place] Pager: [phone] Office: [phone]

## 2019-10-21 ENCOUNTER — Ambulatory Visit: Payer: Medicare Other | Admitting: Cardiology

## 2019-10-28 NOTE — Progress Notes (Signed)
Patient referred by Glenda Chroman, MD for chest pain, aorta aneurysm  Subjective:   Brian Love, male    DOB: 02-Apr-1941, 79 y.o.   MRN: [id number]   Chief Complaint  Patient presents with  . Chest Pain  . New Patient (Initial Visit)    HPI  79 y.o. 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   Patient was last seen by Dr. Einar Gip in 2017, underwent echocardiogram, exercise treadmill stress test showed no ischemic changes, but has up to 3 beat NSVT. He was put on metoprolol tartarate, but came off through his prostate cancer treatment.  More recently, he has experienced left-sided dull chest pain that radiates to his back.  Pain never occurs with physical activity, such as biking.  In fact, he is try to bike faster and longer to try and reproduce the pain, but it has not.  He has noticed that in the past, Prilosec improved his pain.  Pain does not improve after eating.  On a separate note, patient had abdominal aorta ultrasound in 2017 that showed small aortic aneurysm measuring 3 cm.  Wonders if he needs follow-up study for the same.  Past Medical History:  Diagnosis Date  . High cholesterol   . Prostate cancer [facility])      Past Surgical History:  Procedure Laterality Date  . CATARACT EXTRACTION    . KIDNEY STONE SURGERY       Social History   Tobacco Use  Smoking Status Never Smoker  Smokeless Tobacco Never Used    Social History   Substance and Sexual Activity  Alcohol Use Yes   Comment: occasionally     Family History  Problem Relation Age of Onset  . COPD Father      Current Outpatient Medications on File Prior to Visit  Medication Sig Dispense Refill  . Coenzyme Q10 200 MG capsule Take 1 mg by mouth daily.    . darolutamide (NUBEQA) 300 MG tablet Take 1 tablet by mouth 2 (two) times a week.    . gabapentin (NEURONTIN) 300 MG capsule Take 300 mg by mouth 2 (two) times daily.    Marland Kitchen  glucosamine-chondroitin 500-400 MG tablet Take 1 tablet by mouth daily as needed.    . hydrochlorothiazide (HYDRODIURIL) 12.5 MG tablet Take 2 tablets by mouth daily.    . Multiple Vitamin (QUINTABS) TABS Take 1 tablet by mouth daily.    . simvastatin (ZOCOR) 40 MG tablet Take 1 tablet by mouth daily.     No current facility-administered medications on file prior to visit.    Cardiovascular and other pertinent studies:  EKG 10/29/2019: Sinus rhythm 75 bpm. Frequent ectopic ventricular beats   Echocardiogram 10/05/2019: Mild LVH. EF 50-55%. Impaired relaxation. Cannot exclude wall motion abnormalities. Mild TR. Estimated PASP 20-30 mmHg. Aortic root 3.8 cm  EKG: 09/29/2019 Sinus rhythm 87 bpm. Frequent PVC's  CT Abdomen/pelvix 2019: Moderate aortic atherosclerosis. No aneurysmal dilatation. No significantly enlarged lymph nodes  Exercise myoview stress 08/20/2016:  1. The resting electrocardiogram demonstrated normal sinus rhythm, normal resting conduction and PVC. Stress EKG was significant for inducible ventricular arrhythmias, there were episodes of 3 beat NSVT noted during stress testing, frequent PVCs, ventricular bigeminy and ventricular couplets very evident throughout the exercise and in the recovery phase. There is no ST-T wave changes of ischemia. Patient exercised on Bruce protocol for 6:34 minutes and achieved 7.92 METS. Stress terminated due to dyspnea and achieving 99% of MPHR.  2. Myocardial perfusion imaging is normal. Overall left ventricular systolic function was normal without regional wall motion abnormalities. The left ventricular ejection fraction was 73%. This is an intermediate risk study, clinical correlation recommended.   Echocardiogram 08/15/2016:  Left ventricle cavity is normal in size. Normal left ventricular shape. Normal global wall motion. Normal diastolic filling pattern, normal LAP. Calculated EF 67%. Mild tricuspid regurgitation. Mild pulmonary  hypertension. Pulmonary artery systolic pressure is estimated at 30 mm Hg.   Abdominal aortic duplex 08/16/2016: An abdominal aortic aneurysm in the mid aorta measuring 2.98 x 3.04 x 3.02 cm is seen. Focal plaque noted in the proximal, mid and distal aorta. Recheck in 1 year.     Recent labs: 06/08/2019: Chol 196, TG 83, HDL 53, LDL 128 TSH 2.340 normal  12/23/2018: Glucose 104, BUN/Cr 19/1.13. EGFR 62. Na/K 141/3.99 Rest of the CMP normal H/H 14.8/42. MCV 94. Platelets 295                          Testosterone 4 (264-916)   Review of Systems  Cardiovascular: Positive for chest pain. Negative for dyspnea on exertion, leg swelling, palpitations and syncope.  Gastrointestinal: Positive for abdominal pain (Right paraumbilical pain).         Vitals:   10/29/19 1313 10/29/19 1325  BP: (!) 142/92 (!) 142/96  Pulse: 77 76  Temp: (!) 97 F (36.1 C)   SpO2: 97%      Body mass index is 28.41 kg/m. Filed Weights   10/29/19 1313  Weight: 198 lb (89.8 kg)     Objective:   Physical Exam  Constitutional: He appears well-developed and well-nourished.  Neck: No JVD present.  Cardiovascular: Normal rate, regular rhythm, normal heart sounds and intact distal pulses.  No murmur heard. Pulmonary/Chest: Effort normal and breath sounds normal. He has no wheezes. He has no rales.  Musculoskeletal:        General: No edema.  Nursing note and vitals reviewed.        Assessment & Recommendations:   79 y.o. 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   Chest pain: Absolutely no correlation with physical exertion.  Correlation with eating meals improvement with Prilosec suggest possible gastric etiology.  I would hold off stress test at this time.  Consider regular use of Prilosec to see if there is any improvement in his symptoms.  PVCs:  This was also noted even previously on stress test in 2017.  LV function is normal.  He  is asymptomatic from this.  Reasonable to use metoprolol tartrate 25 mg twice daily.  Abdominal aneurysm: Check abdominal aorta ultrasound.   I will see him back in 4 weeks for follow up.   Thank you for referring the patient to [place]. Please feel free to contact with any questions.  Nigel Mormon, MD [facility] [place] Pager: [phone] Office: [phone]

## 2019-10-29 ENCOUNTER — Ambulatory Visit: Payer: Medicare Other | Admitting: Cardiology

## 2019-10-29 ENCOUNTER — Encounter: Payer: Self-pay | Admitting: Cardiology

## 2019-10-29 ENCOUNTER — Other Ambulatory Visit: Payer: Self-pay

## 2019-10-29 VITALS — BP 142/96 | HR 76 | Temp 97.0°F | Ht 70.0 in | Wt 198.0 lb

## 2019-10-29 DIAGNOSIS — I714 Abdominal aortic aneurysm, without rupture, unspecified: Secondary | ICD-10-CM | POA: Insufficient documentation

## 2019-10-29 DIAGNOSIS — I493 Ventricular premature depolarization: Secondary | ICD-10-CM

## 2019-10-29 DIAGNOSIS — H01026 Squamous blepharitis left eye, unspecified eyelid: Secondary | ICD-10-CM | POA: Diagnosis not present

## 2019-10-29 DIAGNOSIS — R0789 Other chest pain: Secondary | ICD-10-CM | POA: Diagnosis not present

## 2019-10-29 DIAGNOSIS — H01023 Squamous blepharitis right eye, unspecified eyelid: Secondary | ICD-10-CM | POA: Diagnosis not present

## 2019-10-29 MED ORDER — METOPROLOL TARTRATE 25 MG PO TABS: 25.0000 mg | ORAL_TABLET | Freq: Two times a day (BID) | ORAL | 3 refills | Status: DC

## 2019-10-30 ENCOUNTER — Other Ambulatory Visit: Payer: Self-pay | Admitting: Cardiology

## 2019-10-30 DIAGNOSIS — I714 Abdominal aortic aneurysm, without rupture, unspecified: Secondary | ICD-10-CM

## 2019-11-18 ENCOUNTER — Other Ambulatory Visit: Payer: Medicare Other

## 2019-11-24 ENCOUNTER — Ambulatory Visit: Payer: Medicare Other

## 2019-11-24 ENCOUNTER — Other Ambulatory Visit: Payer: Self-pay

## 2019-11-24 DIAGNOSIS — I714 Abdominal aortic aneurysm, without rupture, unspecified: Secondary | ICD-10-CM

## 2019-11-26 DIAGNOSIS — H01026 Squamous blepharitis left eye, unspecified eyelid: Secondary | ICD-10-CM | POA: Diagnosis not present

## 2019-11-26 DIAGNOSIS — H04123 Dry eye syndrome of bilateral lacrimal glands: Secondary | ICD-10-CM | POA: Diagnosis not present

## 2019-11-26 DIAGNOSIS — H01023 Squamous blepharitis right eye, unspecified eyelid: Secondary | ICD-10-CM | POA: Diagnosis not present

## 2019-12-02 ENCOUNTER — Encounter: Payer: Self-pay | Admitting: Cardiology

## 2019-12-02 ENCOUNTER — Telehealth: Payer: Medicare Other | Admitting: Cardiology

## 2019-12-02 ENCOUNTER — Other Ambulatory Visit: Payer: Self-pay

## 2019-12-02 DIAGNOSIS — I714 Abdominal aortic aneurysm, without rupture, unspecified: Secondary | ICD-10-CM

## 2019-12-02 DIAGNOSIS — R0789 Other chest pain: Secondary | ICD-10-CM

## 2019-12-02 DIAGNOSIS — I493 Ventricular premature depolarization: Secondary | ICD-10-CM

## 2019-12-02 MED ORDER — METOPROLOL TARTRATE 25 MG PO TABS: 12.5000 mg | ORAL_TABLET | Freq: Two times a day (BID) | ORAL | 3 refills | Status: AC

## 2019-12-02 NOTE — Progress Notes (Signed)
Patient referred by Glenda Chroman, MD for chest pain, aorta aneurysm  Subjective:   Brian Love, male    DOB: Feb 05, 1941, 79 y.o.   MRN: [id number]   I connected with the patient on 12/02/2019 by a telephone call and verified that I am speaking with the correct person using two identifiers.     I offered the patient a video enabled application for a virtual visit. Unfortunately, this could not be accomplished due to technical difficulties/lack of video enabled phone/computer. I discussed the limitations of evaluation and management by telemedicine and the availability of in person appointments. The patient expressed understanding and agreed to proceed.   This visit type was conducted due to national recommendations for restrictions regarding the COVID-19 Pandemic (e.g. social distancing).  This format is felt to be most appropriate for this patient at this time.  All issues noted in this document were discussed and addressed.  No physical exam was performed (except for noted visual exam findings with Tele health visits).  The patient has consented to conduct a Tele health visit and understands insurance will be billed.     Chief Complaint  Patient presents with  . Chest Pain  . Follow-up    4-6 week     HPI  79 y.o. 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   Patient was last seen by Dr. Einar Gip in 2017, underwent echocardiogram, exercise treadmill stress test showed no ischemic changes, but has up to 3 beat NSVT. He was put on metoprolol tartarate, but came off through his prostate cancer treatment.  More recently, he has experienced left-sided dull chest pain that radiates to his back.  Pain never occurs with physical activity, such as biking.  In fact, he is try to bike faster and longer to try and reproduce the pain, but it has not.  He has noticed that in the past, Prilosec improved his pain.  Pain does not improve after  eating. On a separate note, patient had abdominal aorta ultrasound in 2017 that showed small aortic aneurysm measuring 3 cm.    I had recommended Prilosec, suspecting GERD as etiology for his non-exertional chest pain.  He has had complete resolution of his chest pain with Prilosec.  Current Outpatient Medications on File Prior to Visit  Medication Sig Dispense Refill  . Coenzyme Q10 200 MG capsule Take 1 mg by mouth daily.    . darolutamide (NUBEQA) 300 MG tablet Take 1 tablet by mouth 2 (two) times a week.    . gabapentin (NEURONTIN) 300 MG capsule Take 300 mg by mouth 2 (two) times daily.    Marland Kitchen glucosamine-chondroitin 500-400 MG tablet Take 1 tablet by mouth daily as needed.    . hydrochlorothiazide (HYDRODIURIL) 12.5 MG tablet Take 2 tablets by mouth daily.    . metoprolol tartrate (LOPRESSOR) 25 MG tablet Take 1 tablet (25 mg total) by mouth 2 (two) times daily. 60 tablet 3  . Multiple Vitamin (QUINTABS) TABS Take 1 tablet by mouth daily.    . simvastatin (ZOCOR) 40 MG tablet Take 1 tablet by mouth daily.     No current facility-administered medications on file prior to visit.    Cardiovascular and other pertinent studies:  Abdominal Aortic Duplex 11/24/2019:  A slight abdominal aortic aneurysm measuring 2.44 x 2.41 x 2.43 cm is seen  in the mid abdominal aorta. Mild calcific plaque noted.  Bilateral iliac artery velocity normal.  Consider rescan in 5  years to evaluate progression of abdominal aortic  dilatation.  EKG 10/29/2019: Sinus rhythm 75 bpm. Frequent ectopic ventricular beats   Echocardiogram 10/05/2019: Mild LVH. EF 50-55%. Impaired relaxation. Cannot exclude wall motion abnormalities. Mild TR. Estimated PASP 20-30 mmHg. Aortic root 3.8 cm  CT Abdomen/pelvix 2019: Moderate aortic atherosclerosis. No aneurysmal dilatation. No significantly enlarged lymph nodes  Exercise myoview stress 08/20/2016:  1. The resting electrocardiogram demonstrated normal sinus rhythm,  normal resting conduction and PVC. Stress EKG was significant for inducible ventricular arrhythmias, there were episodes of 3 beat NSVT noted during stress testing, frequent PVCs, ventricular bigeminy and ventricular couplets very evident throughout the exercise and in the recovery phase. There is no ST-T wave changes of ischemia. Patient exercised on Bruce protocol for 6:34 minutes and achieved 7.92 METS. Stress terminated due to dyspnea and achieving 99% of MPHR. 2. Myocardial perfusion imaging is normal. Overall left ventricular systolic function was normal without regional wall motion abnormalities. The left ventricular ejection fraction was 73%. This is an intermediate risk study, clinical correlation recommended.    Recent labs: 06/08/2019: Chol 196, TG 83, HDL 53, LDL 128 TSH 2.340 normal  12/23/2018: Glucose 104, BUN/Cr 19/1.13. EGFR 62. Na/K 141/3.99 Rest of the CMP normal H/H 14.8/42. MCV 94. Platelets 295                          Testosterone 4 ([id number])   Review of Systems  Cardiovascular: Negative for chest pain, dyspnea on exertion, leg swelling, palpitations and syncope.  Gastrointestinal: Negative for abdominal pain.         No vitals available.  Objective:   Physical Exam   Not performed.  Telephone visit.      Assessment & Recommendations:   79 y.o. 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   Chest pain: Complete resolution with thoracic.  Suspect this was related to GERD.  Continue follow-up with PCP Dr. 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  Refill metoprolol tartrate at 12.5 mg twice daily.  Abdominal aneurysm: Minimal increase in aortic diameter.  Repeat ultrasound in 5 years.  I will see him on as-needed basis.  Nigel Mormon, MD [facility] [place] Pager: [phone] Office: [phone]

## 2019-12-18 IMAGING — DX DG CHEST 2V
2 series · 2 of 2 positions shown · non-contrast
Comparison: 08/11/2017

CLINICAL DATA: Dry cough with fever and diarrhea

EXAM:
CHEST  2 VIEW

[chest pa]
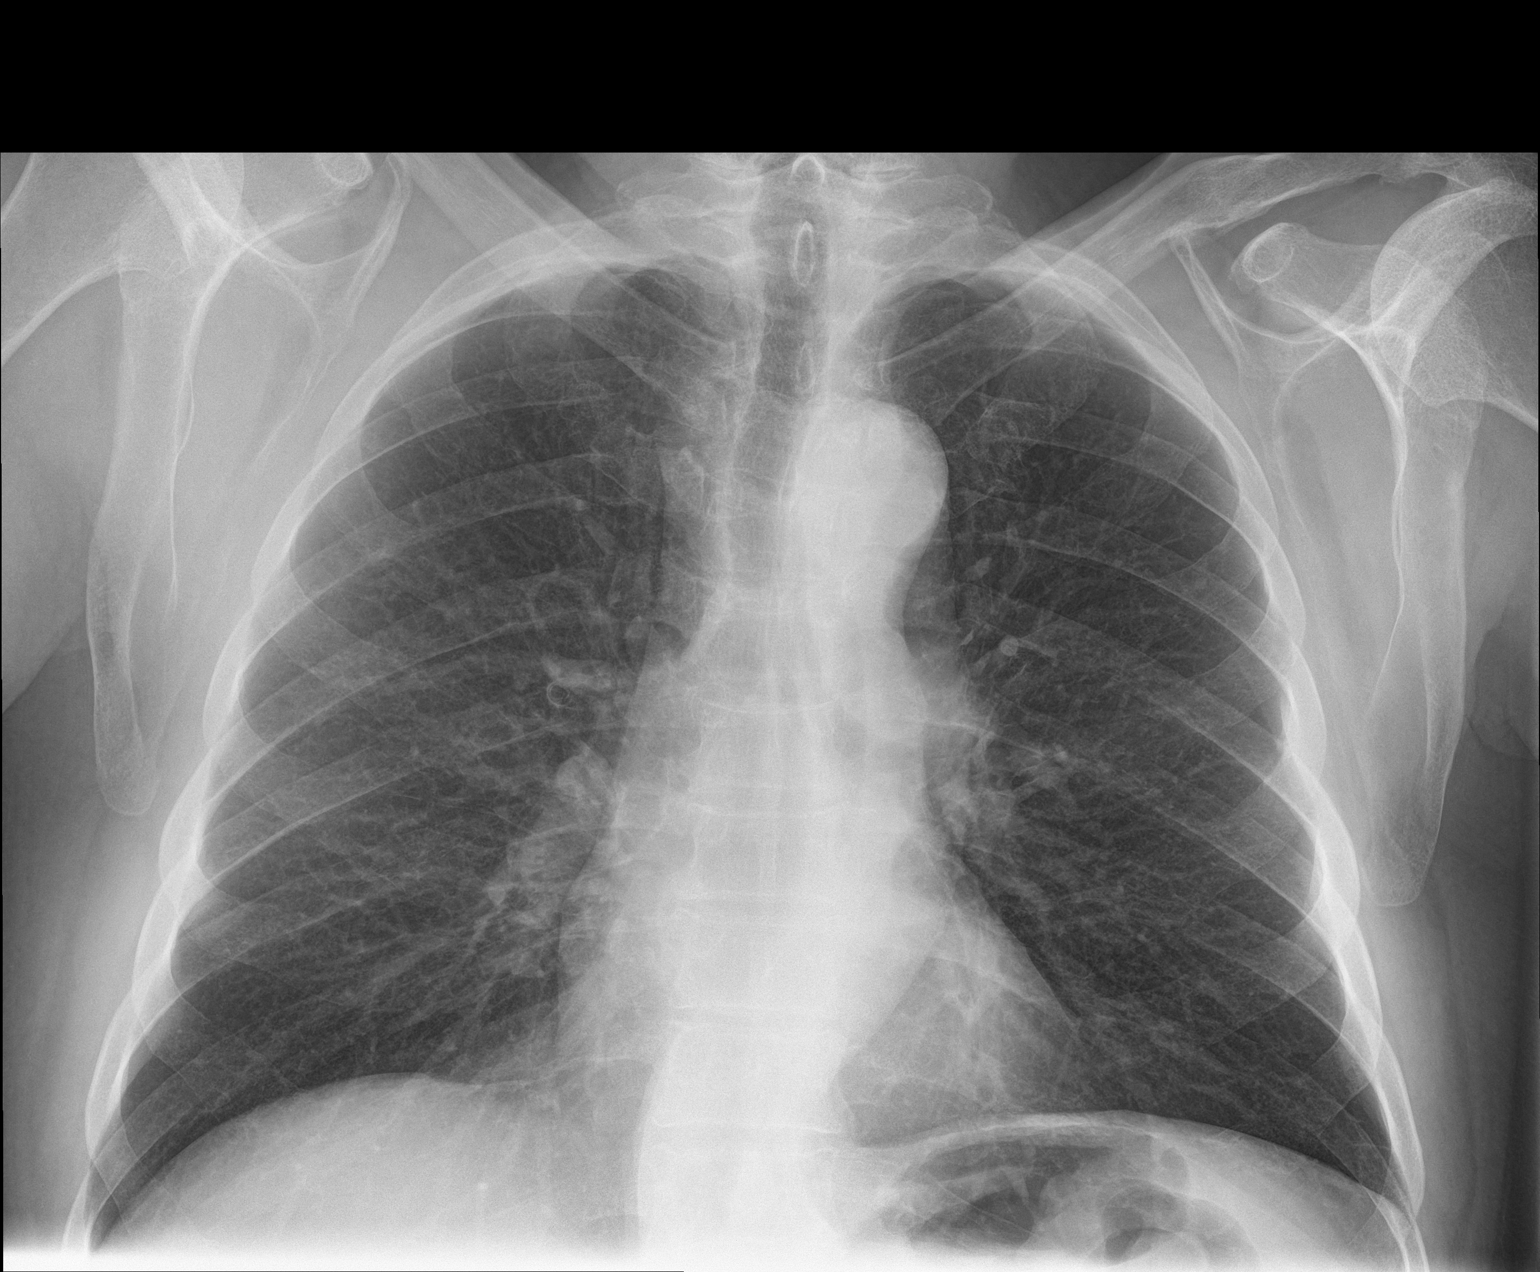

[chest lat]
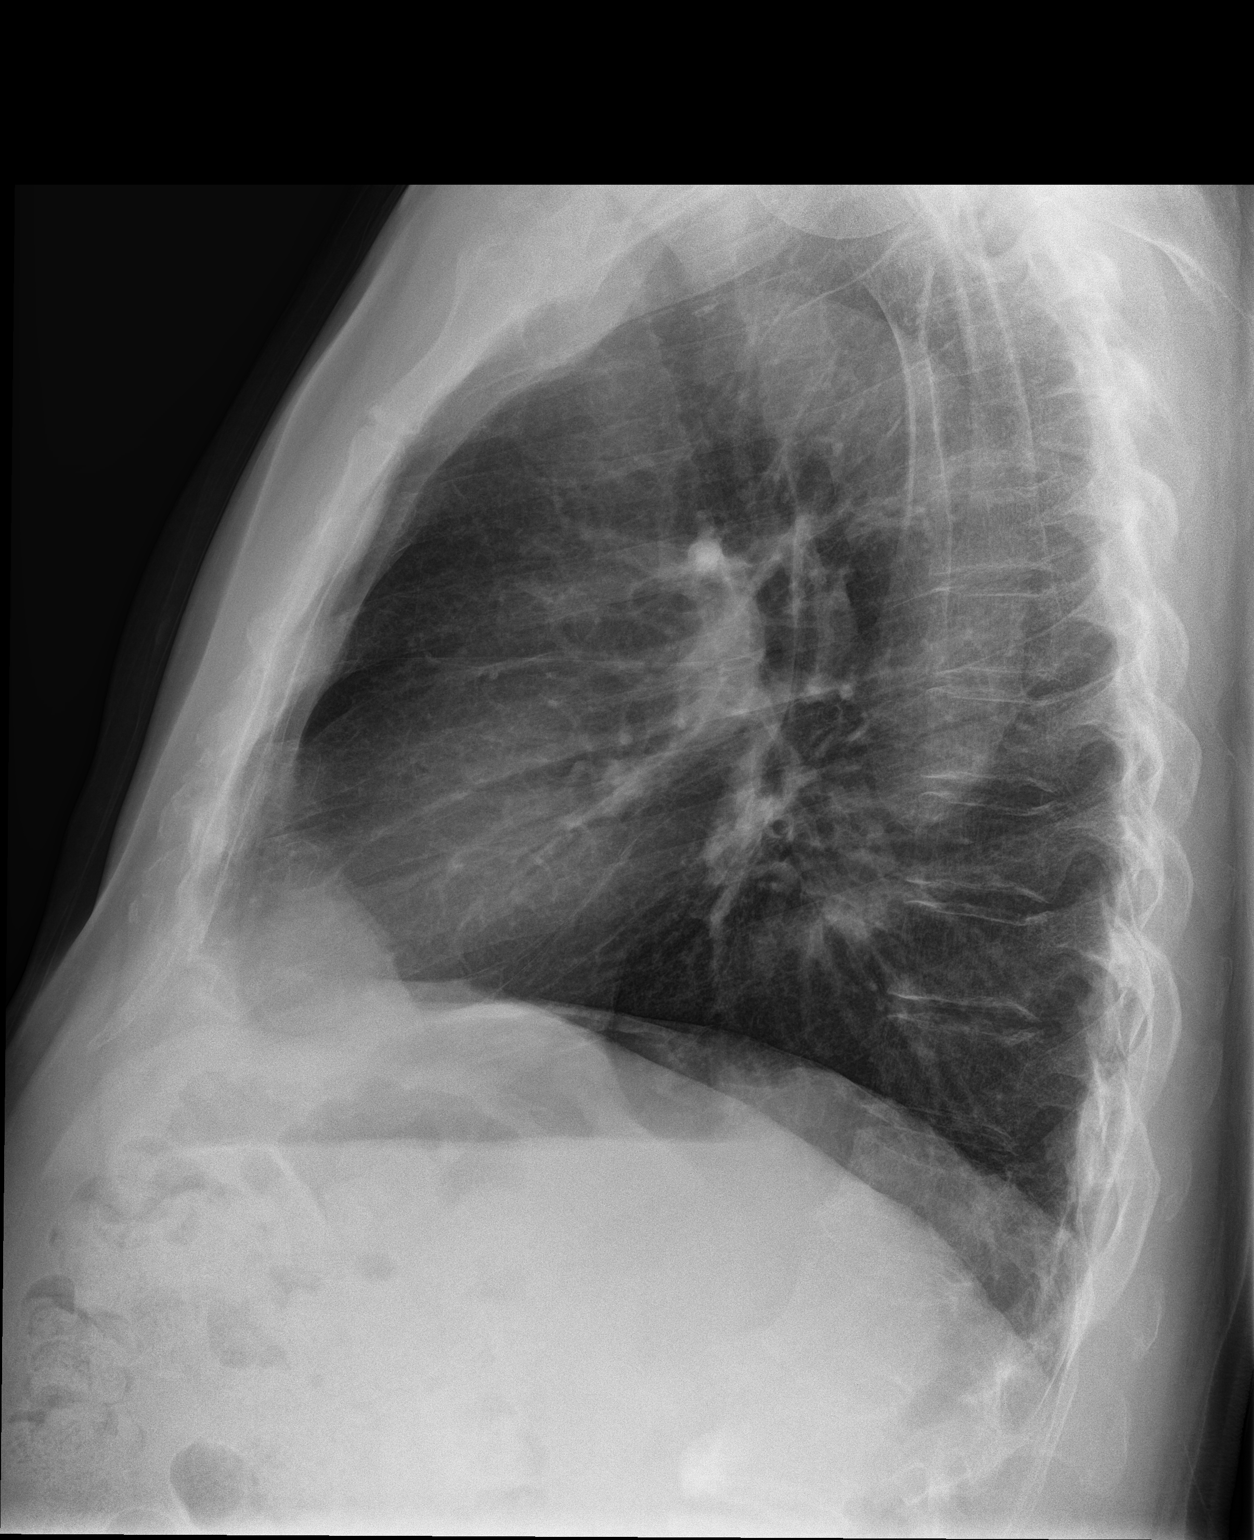

[2 of 2 positions shown; findings below may reference images not displayed]

FINDINGS: The heart size and mediastinal contours are within normal limits.
Both lungs are clear. Degenerative changes of the spine.
IMPRESSION: No active cardiopulmonary disease.

## 2019-12-31 DIAGNOSIS — H01023 Squamous blepharitis right eye, unspecified eyelid: Secondary | ICD-10-CM | POA: Diagnosis not present

## 2019-12-31 DIAGNOSIS — H04123 Dry eye syndrome of bilateral lacrimal glands: Secondary | ICD-10-CM | POA: Diagnosis not present

## 2019-12-31 DIAGNOSIS — H16223 Keratoconjunctivitis sicca, not specified as Sjogren's, bilateral: Secondary | ICD-10-CM | POA: Diagnosis not present

## 2019-12-31 DIAGNOSIS — H01026 Squamous blepharitis left eye, unspecified eyelid: Secondary | ICD-10-CM | POA: Diagnosis not present

## 2020-01-04 DIAGNOSIS — Z299 Encounter for prophylactic measures, unspecified: Secondary | ICD-10-CM | POA: Diagnosis not present

## 2020-01-04 DIAGNOSIS — Z1211 Encounter for screening for malignant neoplasm of colon: Secondary | ICD-10-CM | POA: Diagnosis not present

## 2020-01-04 DIAGNOSIS — Z7189 Other specified counseling: Secondary | ICD-10-CM | POA: Diagnosis not present

## 2020-01-04 DIAGNOSIS — Z0000 Encounter for general adult medical examination without abnormal findings: Secondary | ICD-10-CM | POA: Diagnosis not present

## 2020-01-05 DIAGNOSIS — C775 Secondary and unspecified malignant neoplasm of intrapelvic lymph nodes: Secondary | ICD-10-CM | POA: Diagnosis not present

## 2020-01-05 DIAGNOSIS — G620 Drug-induced polyneuropathy: Secondary | ICD-10-CM | POA: Diagnosis not present

## 2020-01-05 DIAGNOSIS — T451X5A Adverse effect of antineoplastic and immunosuppressive drugs, initial encounter: Secondary | ICD-10-CM | POA: Diagnosis not present

## 2020-01-05 DIAGNOSIS — Z192 Hormone resistant malignancy status: Secondary | ICD-10-CM | POA: Diagnosis not present

## 2020-01-05 DIAGNOSIS — T451X5D Adverse effect of antineoplastic and immunosuppressive drugs, subsequent encounter: Secondary | ICD-10-CM | POA: Diagnosis not present

## 2020-01-05 DIAGNOSIS — R609 Edema, unspecified: Secondary | ICD-10-CM | POA: Diagnosis not present

## 2020-01-05 DIAGNOSIS — Z5111 Encounter for antineoplastic chemotherapy: Secondary | ICD-10-CM | POA: Diagnosis not present

## 2020-01-05 DIAGNOSIS — R531 Weakness: Secondary | ICD-10-CM | POA: Diagnosis not present

## 2020-01-05 DIAGNOSIS — Z79899 Other long term (current) drug therapy: Secondary | ICD-10-CM | POA: Diagnosis not present

## 2020-01-05 DIAGNOSIS — Z923 Personal history of irradiation: Secondary | ICD-10-CM | POA: Diagnosis not present

## 2020-01-06 DIAGNOSIS — E7800 Pure hypercholesterolemia, unspecified: Secondary | ICD-10-CM | POA: Diagnosis not present

## 2020-03-07 DIAGNOSIS — H01023 Squamous blepharitis right eye, unspecified eyelid: Secondary | ICD-10-CM | POA: Diagnosis not present

## 2020-03-07 DIAGNOSIS — H04123 Dry eye syndrome of bilateral lacrimal glands: Secondary | ICD-10-CM | POA: Diagnosis not present

## 2020-03-07 DIAGNOSIS — H01026 Squamous blepharitis left eye, unspecified eyelid: Secondary | ICD-10-CM | POA: Diagnosis not present

## 2020-03-07 DIAGNOSIS — H16223 Keratoconjunctivitis sicca, not specified as Sjogren's, bilateral: Secondary | ICD-10-CM | POA: Diagnosis not present

## 2020-03-11 DIAGNOSIS — T451X5A Adverse effect of antineoplastic and immunosuppressive drugs, initial encounter: Secondary | ICD-10-CM | POA: Diagnosis not present

## 2020-03-11 DIAGNOSIS — R270 Ataxia, unspecified: Secondary | ICD-10-CM | POA: Diagnosis not present

## 2020-03-11 DIAGNOSIS — Z923 Personal history of irradiation: Secondary | ICD-10-CM | POA: Diagnosis not present

## 2020-03-11 DIAGNOSIS — G620 Drug-induced polyneuropathy: Secondary | ICD-10-CM | POA: Diagnosis not present

## 2020-04-05 DIAGNOSIS — Z79899 Other long term (current) drug therapy: Secondary | ICD-10-CM | POA: Diagnosis not present

## 2020-04-05 DIAGNOSIS — Z192 Hormone resistant malignancy status: Secondary | ICD-10-CM | POA: Diagnosis not present

## 2020-04-05 DIAGNOSIS — G620 Drug-induced polyneuropathy: Secondary | ICD-10-CM | POA: Diagnosis not present

## 2020-04-28 DIAGNOSIS — Z87442 Personal history of urinary calculi: Secondary | ICD-10-CM | POA: Diagnosis not present

## 2020-04-28 DIAGNOSIS — T451X5D Adverse effect of antineoplastic and immunosuppressive drugs, subsequent encounter: Secondary | ICD-10-CM | POA: Diagnosis not present

## 2020-04-28 DIAGNOSIS — T451X5A Adverse effect of antineoplastic and immunosuppressive drugs, initial encounter: Secondary | ICD-10-CM | POA: Diagnosis not present

## 2020-04-28 DIAGNOSIS — R350 Frequency of micturition: Secondary | ICD-10-CM | POA: Diagnosis not present

## 2020-04-28 DIAGNOSIS — R232 Flushing: Secondary | ICD-10-CM | POA: Diagnosis not present

## 2020-04-28 DIAGNOSIS — Z79899 Other long term (current) drug therapy: Secondary | ICD-10-CM | POA: Diagnosis not present

## 2020-04-28 DIAGNOSIS — N1330 Unspecified hydronephrosis: Secondary | ICD-10-CM | POA: Diagnosis not present

## 2020-04-28 DIAGNOSIS — N3289 Other specified disorders of bladder: Secondary | ICD-10-CM | POA: Diagnosis not present

## 2020-04-28 DIAGNOSIS — K7689 Other specified diseases of liver: Secondary | ICD-10-CM | POA: Diagnosis not present

## 2020-04-28 DIAGNOSIS — G620 Drug-induced polyneuropathy: Secondary | ICD-10-CM | POA: Diagnosis not present

## 2020-04-28 DIAGNOSIS — R911 Solitary pulmonary nodule: Secondary | ICD-10-CM | POA: Diagnosis not present

## 2020-04-28 DIAGNOSIS — Z9221 Personal history of antineoplastic chemotherapy: Secondary | ICD-10-CM | POA: Diagnosis not present

## 2020-04-28 DIAGNOSIS — R590 Localized enlarged lymph nodes: Secondary | ICD-10-CM | POA: Diagnosis not present

## 2020-05-05 DIAGNOSIS — N1339 Other hydronephrosis: Secondary | ICD-10-CM | POA: Diagnosis not present

## 2020-05-05 DIAGNOSIS — N200 Calculus of kidney: Secondary | ICD-10-CM | POA: Diagnosis not present

## 2020-05-10 DIAGNOSIS — N131 Hydronephrosis with ureteral stricture, not elsewhere classified: Secondary | ICD-10-CM | POA: Diagnosis not present

## 2020-05-10 DIAGNOSIS — N1330 Unspecified hydronephrosis: Secondary | ICD-10-CM | POA: Diagnosis not present

## 2020-05-24 DIAGNOSIS — N1339 Other hydronephrosis: Secondary | ICD-10-CM | POA: Diagnosis not present

## 2020-05-24 DIAGNOSIS — E872 Acidosis: Secondary | ICD-10-CM | POA: Diagnosis not present

## 2020-05-24 DIAGNOSIS — A419 Sepsis, unspecified organism: Secondary | ICD-10-CM | POA: Diagnosis not present

## 2020-05-24 DIAGNOSIS — R531 Weakness: Secondary | ICD-10-CM | POA: Diagnosis not present

## 2020-05-24 DIAGNOSIS — N12 Tubulo-interstitial nephritis, not specified as acute or chronic: Secondary | ICD-10-CM | POA: Diagnosis not present

## 2020-05-24 DIAGNOSIS — A4181 Sepsis due to Enterococcus: Secondary | ICD-10-CM | POA: Diagnosis not present

## 2020-05-24 DIAGNOSIS — R509 Fever, unspecified: Secondary | ICD-10-CM | POA: Diagnosis not present

## 2020-05-24 DIAGNOSIS — T83092A Other mechanical complication of nephrostomy catheter, initial encounter: Secondary | ICD-10-CM | POA: Diagnosis not present

## 2020-05-24 DIAGNOSIS — I959 Hypotension, unspecified: Secondary | ICD-10-CM | POA: Diagnosis not present

## 2020-05-24 DIAGNOSIS — N390 Urinary tract infection, site not specified: Secondary | ICD-10-CM | POA: Diagnosis not present

## 2020-05-24 DIAGNOSIS — E785 Hyperlipidemia, unspecified: Secondary | ICD-10-CM | POA: Diagnosis not present

## 2020-05-24 DIAGNOSIS — N10 Acute tubulo-interstitial nephritis: Secondary | ICD-10-CM | POA: Diagnosis not present

## 2020-05-24 DIAGNOSIS — N179 Acute kidney failure, unspecified: Secondary | ICD-10-CM | POA: Diagnosis not present

## 2020-05-24 DIAGNOSIS — E871 Hypo-osmolality and hyponatremia: Secondary | ICD-10-CM | POA: Diagnosis not present

## 2020-05-24 DIAGNOSIS — Z743 Need for continuous supervision: Secondary | ICD-10-CM | POA: Diagnosis not present

## 2020-05-24 DIAGNOSIS — R0902 Hypoxemia: Secondary | ICD-10-CM | POA: Diagnosis not present

## 2020-05-24 DIAGNOSIS — I10 Essential (primary) hypertension: Secondary | ICD-10-CM | POA: Diagnosis not present

## 2020-05-24 DIAGNOSIS — R6889 Other general symptoms and signs: Secondary | ICD-10-CM | POA: Diagnosis not present

## 2020-05-24 DIAGNOSIS — G620 Drug-induced polyneuropathy: Secondary | ICD-10-CM | POA: Diagnosis not present

## 2020-05-24 DIAGNOSIS — R6521 Severe sepsis with septic shock: Secondary | ICD-10-CM | POA: Diagnosis not present

## 2020-05-24 DIAGNOSIS — N1330 Unspecified hydronephrosis: Secondary | ICD-10-CM | POA: Diagnosis not present

## 2020-05-24 DIAGNOSIS — B952 Enterococcus as the cause of diseases classified elsewhere: Secondary | ICD-10-CM | POA: Diagnosis not present

## 2020-05-24 DIAGNOSIS — N200 Calculus of kidney: Secondary | ICD-10-CM | POA: Diagnosis not present

## 2020-05-24 DIAGNOSIS — T451X5A Adverse effect of antineoplastic and immunosuppressive drugs, initial encounter: Secondary | ICD-10-CM | POA: Diagnosis not present

## 2020-05-24 DIAGNOSIS — N136 Pyonephrosis: Secondary | ICD-10-CM | POA: Diagnosis not present

## 2020-05-24 DIAGNOSIS — N320 Bladder-neck obstruction: Secondary | ICD-10-CM | POA: Diagnosis not present

## 2020-05-31 DIAGNOSIS — Z192 Hormone resistant malignancy status: Secondary | ICD-10-CM | POA: Diagnosis not present

## 2020-05-31 DIAGNOSIS — N390 Urinary tract infection, site not specified: Secondary | ICD-10-CM | POA: Diagnosis not present

## 2020-05-31 DIAGNOSIS — Z09 Encounter for follow-up examination after completed treatment for conditions other than malignant neoplasm: Secondary | ICD-10-CM | POA: Diagnosis not present

## 2020-06-07 DIAGNOSIS — Z79899 Other long term (current) drug therapy: Secondary | ICD-10-CM | POA: Diagnosis not present

## 2020-06-07 DIAGNOSIS — N136 Pyonephrosis: Secondary | ICD-10-CM | POA: Diagnosis not present

## 2020-06-07 DIAGNOSIS — T451X5D Adverse effect of antineoplastic and immunosuppressive drugs, subsequent encounter: Secondary | ICD-10-CM | POA: Diagnosis not present

## 2020-06-07 DIAGNOSIS — G620 Drug-induced polyneuropathy: Secondary | ICD-10-CM | POA: Diagnosis not present

## 2020-06-07 DIAGNOSIS — Z192 Hormone resistant malignancy status: Secondary | ICD-10-CM | POA: Diagnosis not present

## 2020-06-15 DIAGNOSIS — T451X5A Adverse effect of antineoplastic and immunosuppressive drugs, initial encounter: Secondary | ICD-10-CM | POA: Diagnosis not present

## 2020-06-15 DIAGNOSIS — R93421 Abnormal radiologic findings on diagnostic imaging of right kidney: Secondary | ICD-10-CM | POA: Diagnosis not present

## 2020-06-15 DIAGNOSIS — I714 Abdominal aortic aneurysm, without rupture: Secondary | ICD-10-CM | POA: Diagnosis not present

## 2020-06-15 DIAGNOSIS — Z79899 Other long term (current) drug therapy: Secondary | ICD-10-CM | POA: Diagnosis not present

## 2020-06-15 DIAGNOSIS — Z9221 Personal history of antineoplastic chemotherapy: Secondary | ICD-10-CM | POA: Diagnosis not present

## 2020-06-15 DIAGNOSIS — N12 Tubulo-interstitial nephritis, not specified as acute or chronic: Secondary | ICD-10-CM | POA: Diagnosis not present

## 2020-06-15 DIAGNOSIS — Z936 Other artificial openings of urinary tract status: Secondary | ICD-10-CM | POA: Diagnosis not present

## 2020-06-15 DIAGNOSIS — B965 Pseudomonas (aeruginosa) (mallei) (pseudomallei) as the cause of diseases classified elsewhere: Secondary | ICD-10-CM | POA: Diagnosis not present

## 2020-06-15 DIAGNOSIS — N136 Pyonephrosis: Secondary | ICD-10-CM | POA: Diagnosis not present

## 2020-06-15 DIAGNOSIS — R509 Fever, unspecified: Secondary | ICD-10-CM | POA: Diagnosis not present

## 2020-06-15 DIAGNOSIS — K5730 Diverticulosis of large intestine without perforation or abscess without bleeding: Secondary | ICD-10-CM | POA: Diagnosis not present

## 2020-06-15 DIAGNOSIS — R197 Diarrhea, unspecified: Secondary | ICD-10-CM | POA: Diagnosis not present

## 2020-06-15 DIAGNOSIS — I10 Essential (primary) hypertension: Secondary | ICD-10-CM | POA: Diagnosis not present

## 2020-06-15 DIAGNOSIS — N3289 Other specified disorders of bladder: Secondary | ICD-10-CM | POA: Diagnosis not present

## 2020-06-15 DIAGNOSIS — Z923 Personal history of irradiation: Secondary | ICD-10-CM | POA: Diagnosis not present

## 2020-06-15 DIAGNOSIS — K449 Diaphragmatic hernia without obstruction or gangrene: Secondary | ICD-10-CM | POA: Diagnosis not present

## 2020-06-15 DIAGNOSIS — E785 Hyperlipidemia, unspecified: Secondary | ICD-10-CM | POA: Diagnosis not present

## 2020-06-15 DIAGNOSIS — G620 Drug-induced polyneuropathy: Secondary | ICD-10-CM | POA: Diagnosis not present

## 2020-06-16 DIAGNOSIS — N10 Acute tubulo-interstitial nephritis: Secondary | ICD-10-CM | POA: Diagnosis not present

## 2020-06-16 DIAGNOSIS — Z936 Other artificial openings of urinary tract status: Secondary | ICD-10-CM | POA: Diagnosis not present

## 2020-06-16 DIAGNOSIS — G620 Drug-induced polyneuropathy: Secondary | ICD-10-CM | POA: Diagnosis not present

## 2020-06-17 DIAGNOSIS — G620 Drug-induced polyneuropathy: Secondary | ICD-10-CM | POA: Diagnosis not present

## 2020-06-17 DIAGNOSIS — Z960 Presence of urogenital implants: Secondary | ICD-10-CM | POA: Diagnosis not present

## 2020-06-17 DIAGNOSIS — N131 Hydronephrosis with ureteral stricture, not elsewhere classified: Secondary | ICD-10-CM | POA: Diagnosis not present

## 2020-06-17 DIAGNOSIS — B965 Pseudomonas (aeruginosa) (mallei) (pseudomallei) as the cause of diseases classified elsewhere: Secondary | ICD-10-CM | POA: Diagnosis not present

## 2020-06-24 DIAGNOSIS — Z299 Encounter for prophylactic measures, unspecified: Secondary | ICD-10-CM | POA: Diagnosis not present

## 2020-06-24 DIAGNOSIS — I714 Abdominal aortic aneurysm, without rupture: Secondary | ICD-10-CM | POA: Diagnosis not present

## 2020-06-24 DIAGNOSIS — Z09 Encounter for follow-up examination after completed treatment for conditions other than malignant neoplasm: Secondary | ICD-10-CM | POA: Diagnosis not present

## 2020-06-24 DIAGNOSIS — N390 Urinary tract infection, site not specified: Secondary | ICD-10-CM | POA: Diagnosis not present

## 2020-06-24 DIAGNOSIS — I77810 Thoracic aortic ectasia: Secondary | ICD-10-CM | POA: Diagnosis not present

## 2020-06-30 DIAGNOSIS — N1339 Other hydronephrosis: Secondary | ICD-10-CM | POA: Diagnosis not present

## 2020-06-30 DIAGNOSIS — Z466 Encounter for fitting and adjustment of urinary device: Secondary | ICD-10-CM | POA: Diagnosis not present

## 2020-06-30 DIAGNOSIS — Z5111 Encounter for antineoplastic chemotherapy: Secondary | ICD-10-CM | POA: Diagnosis not present

## 2020-06-30 DIAGNOSIS — Z192 Hormone resistant malignancy status: Secondary | ICD-10-CM | POA: Diagnosis not present

## 2020-06-30 DIAGNOSIS — Z936 Other artificial openings of urinary tract status: Secondary | ICD-10-CM | POA: Diagnosis not present

## 2020-07-28 DIAGNOSIS — T451X5D Adverse effect of antineoplastic and immunosuppressive drugs, subsequent encounter: Secondary | ICD-10-CM | POA: Diagnosis not present

## 2020-07-28 DIAGNOSIS — N136 Pyonephrosis: Secondary | ICD-10-CM | POA: Diagnosis not present

## 2020-07-28 DIAGNOSIS — C7951 Secondary malignant neoplasm of bone: Secondary | ICD-10-CM | POA: Diagnosis not present

## 2020-07-28 DIAGNOSIS — T451X5A Adverse effect of antineoplastic and immunosuppressive drugs, initial encounter: Secondary | ICD-10-CM | POA: Diagnosis not present

## 2020-07-28 DIAGNOSIS — R918 Other nonspecific abnormal finding of lung field: Secondary | ICD-10-CM | POA: Diagnosis not present

## 2020-07-28 DIAGNOSIS — G620 Drug-induced polyneuropathy: Secondary | ICD-10-CM | POA: Diagnosis not present

## 2020-08-16 DIAGNOSIS — Z466 Encounter for fitting and adjustment of urinary device: Secondary | ICD-10-CM | POA: Diagnosis not present

## 2020-08-16 DIAGNOSIS — Z436 Encounter for attention to other artificial openings of urinary tract: Secondary | ICD-10-CM | POA: Diagnosis not present

## 2020-08-16 DIAGNOSIS — N200 Calculus of kidney: Secondary | ICD-10-CM | POA: Diagnosis not present

## 2020-08-17 DIAGNOSIS — Z79899 Other long term (current) drug therapy: Secondary | ICD-10-CM | POA: Diagnosis not present

## 2020-08-17 DIAGNOSIS — N12 Tubulo-interstitial nephritis, not specified as acute or chronic: Secondary | ICD-10-CM | POA: Diagnosis not present

## 2020-08-17 DIAGNOSIS — N200 Calculus of kidney: Secondary | ICD-10-CM | POA: Diagnosis not present

## 2020-08-17 DIAGNOSIS — G8918 Other acute postprocedural pain: Secondary | ICD-10-CM | POA: Diagnosis not present

## 2020-08-17 DIAGNOSIS — T83512A Infection and inflammatory reaction due to nephrostomy catheter, initial encounter: Secondary | ICD-10-CM | POA: Diagnosis not present

## 2020-08-17 DIAGNOSIS — G629 Polyneuropathy, unspecified: Secondary | ICD-10-CM | POA: Diagnosis not present

## 2020-08-17 DIAGNOSIS — I10 Essential (primary) hypertension: Secondary | ICD-10-CM | POA: Diagnosis not present

## 2020-08-17 DIAGNOSIS — Z9221 Personal history of antineoplastic chemotherapy: Secondary | ICD-10-CM | POA: Diagnosis not present

## 2020-08-17 DIAGNOSIS — R509 Fever, unspecified: Secondary | ICD-10-CM | POA: Diagnosis not present

## 2020-08-17 DIAGNOSIS — Z923 Personal history of irradiation: Secondary | ICD-10-CM | POA: Diagnosis not present

## 2020-08-17 DIAGNOSIS — R1031 Right lower quadrant pain: Secondary | ICD-10-CM | POA: Diagnosis not present

## 2020-08-17 DIAGNOSIS — Z87440 Personal history of urinary (tract) infections: Secondary | ICD-10-CM | POA: Diagnosis not present

## 2020-08-17 DIAGNOSIS — N390 Urinary tract infection, site not specified: Secondary | ICD-10-CM | POA: Diagnosis not present

## 2020-08-17 DIAGNOSIS — Z9889 Other specified postprocedural states: Secondary | ICD-10-CM | POA: Diagnosis not present

## 2020-08-17 DIAGNOSIS — Z23 Encounter for immunization: Secondary | ICD-10-CM | POA: Diagnosis not present

## 2020-08-17 DIAGNOSIS — R109 Unspecified abdominal pain: Secondary | ICD-10-CM | POA: Diagnosis not present

## 2020-08-17 DIAGNOSIS — E785 Hyperlipidemia, unspecified: Secondary | ICD-10-CM | POA: Diagnosis not present

## 2020-08-18 DIAGNOSIS — T83512A Infection and inflammatory reaction due to nephrostomy catheter, initial encounter: Secondary | ICD-10-CM | POA: Diagnosis not present

## 2020-08-18 DIAGNOSIS — N10 Acute tubulo-interstitial nephritis: Secondary | ICD-10-CM | POA: Diagnosis not present

## 2020-08-18 DIAGNOSIS — I10 Essential (primary) hypertension: Secondary | ICD-10-CM | POA: Diagnosis not present

## 2020-08-18 DIAGNOSIS — N390 Urinary tract infection, site not specified: Secondary | ICD-10-CM | POA: Diagnosis not present

## 2020-08-19 DIAGNOSIS — T83512A Infection and inflammatory reaction due to nephrostomy catheter, initial encounter: Secondary | ICD-10-CM | POA: Diagnosis not present

## 2020-08-19 DIAGNOSIS — Z466 Encounter for fitting and adjustment of urinary device: Secondary | ICD-10-CM | POA: Diagnosis not present

## 2020-08-19 DIAGNOSIS — N390 Urinary tract infection, site not specified: Secondary | ICD-10-CM | POA: Diagnosis not present

## 2020-08-19 DIAGNOSIS — N10 Acute tubulo-interstitial nephritis: Secondary | ICD-10-CM | POA: Diagnosis not present

## 2020-08-19 DIAGNOSIS — I10 Essential (primary) hypertension: Secondary | ICD-10-CM | POA: Diagnosis not present

## 2020-08-25 DIAGNOSIS — Z5111 Encounter for antineoplastic chemotherapy: Secondary | ICD-10-CM | POA: Diagnosis not present

## 2020-08-25 DIAGNOSIS — T451X5A Adverse effect of antineoplastic and immunosuppressive drugs, initial encounter: Secondary | ICD-10-CM | POA: Diagnosis not present

## 2020-08-25 DIAGNOSIS — Z192 Hormone resistant malignancy status: Secondary | ICD-10-CM | POA: Diagnosis not present

## 2020-08-25 DIAGNOSIS — T451X5S Adverse effect of antineoplastic and immunosuppressive drugs, sequela: Secondary | ICD-10-CM | POA: Diagnosis not present

## 2020-08-25 DIAGNOSIS — N136 Pyonephrosis: Secondary | ICD-10-CM | POA: Diagnosis not present

## 2020-08-25 DIAGNOSIS — G620 Drug-induced polyneuropathy: Secondary | ICD-10-CM | POA: Diagnosis not present

## 2020-08-25 DIAGNOSIS — R918 Other nonspecific abnormal finding of lung field: Secondary | ICD-10-CM | POA: Diagnosis not present

## 2020-08-30 DIAGNOSIS — N390 Urinary tract infection, site not specified: Secondary | ICD-10-CM | POA: Diagnosis not present

## 2020-08-30 DIAGNOSIS — Z299 Encounter for prophylactic measures, unspecified: Secondary | ICD-10-CM | POA: Diagnosis not present

## 2020-08-30 DIAGNOSIS — Z192 Hormone resistant malignancy status: Secondary | ICD-10-CM | POA: Diagnosis not present

## 2020-08-30 DIAGNOSIS — C7951 Secondary malignant neoplasm of bone: Secondary | ICD-10-CM | POA: Diagnosis not present

## 2020-08-30 DIAGNOSIS — I10 Essential (primary) hypertension: Secondary | ICD-10-CM | POA: Diagnosis not present

## 2020-09-06 DIAGNOSIS — H04123 Dry eye syndrome of bilateral lacrimal glands: Secondary | ICD-10-CM | POA: Diagnosis not present

## 2020-09-06 DIAGNOSIS — H01023 Squamous blepharitis right eye, unspecified eyelid: Secondary | ICD-10-CM | POA: Diagnosis not present

## 2020-09-06 DIAGNOSIS — H16223 Keratoconjunctivitis sicca, not specified as Sjogren's, bilateral: Secondary | ICD-10-CM | POA: Diagnosis not present

## 2020-09-06 DIAGNOSIS — H01026 Squamous blepharitis left eye, unspecified eyelid: Secondary | ICD-10-CM | POA: Diagnosis not present

## 2020-09-07 DIAGNOSIS — N390 Urinary tract infection, site not specified: Secondary | ICD-10-CM | POA: Diagnosis not present

## 2020-09-15 DIAGNOSIS — R918 Other nonspecific abnormal finding of lung field: Secondary | ICD-10-CM | POA: Diagnosis not present

## 2020-09-15 DIAGNOSIS — T451X5A Adverse effect of antineoplastic and immunosuppressive drugs, initial encounter: Secondary | ICD-10-CM | POA: Diagnosis not present

## 2020-09-15 DIAGNOSIS — N136 Pyonephrosis: Secondary | ICD-10-CM | POA: Diagnosis not present

## 2020-09-15 DIAGNOSIS — G620 Drug-induced polyneuropathy: Secondary | ICD-10-CM | POA: Diagnosis not present

## 2020-09-15 DIAGNOSIS — Z79899 Other long term (current) drug therapy: Secondary | ICD-10-CM | POA: Diagnosis not present

## 2020-09-15 DIAGNOSIS — Z192 Hormone resistant malignancy status: Secondary | ICD-10-CM | POA: Diagnosis not present

## 2020-09-23 DIAGNOSIS — D6869 Other thrombophilia: Secondary | ICD-10-CM | POA: Diagnosis not present

## 2020-09-23 DIAGNOSIS — I714 Abdominal aortic aneurysm, without rupture: Secondary | ICD-10-CM | POA: Diagnosis not present

## 2020-09-23 DIAGNOSIS — N390 Urinary tract infection, site not specified: Secondary | ICD-10-CM | POA: Diagnosis not present

## 2020-09-23 DIAGNOSIS — Z299 Encounter for prophylactic measures, unspecified: Secondary | ICD-10-CM | POA: Diagnosis not present

## 2020-09-23 DIAGNOSIS — I77810 Thoracic aortic ectasia: Secondary | ICD-10-CM | POA: Diagnosis not present

## 2020-10-06 DIAGNOSIS — R5383 Other fatigue: Secondary | ICD-10-CM | POA: Diagnosis not present

## 2020-10-06 DIAGNOSIS — Z79899 Other long term (current) drug therapy: Secondary | ICD-10-CM | POA: Diagnosis not present

## 2020-10-06 DIAGNOSIS — G629 Polyneuropathy, unspecified: Secondary | ICD-10-CM | POA: Diagnosis not present

## 2020-10-06 DIAGNOSIS — Z192 Hormone resistant malignancy status: Secondary | ICD-10-CM | POA: Diagnosis not present

## 2020-10-06 DIAGNOSIS — Z87440 Personal history of urinary (tract) infections: Secondary | ICD-10-CM | POA: Diagnosis not present

## 2020-10-06 DIAGNOSIS — R232 Flushing: Secondary | ICD-10-CM | POA: Diagnosis not present

## 2020-10-06 DIAGNOSIS — N1330 Unspecified hydronephrosis: Secondary | ICD-10-CM | POA: Diagnosis not present

## 2020-10-21 DIAGNOSIS — I10 Essential (primary) hypertension: Secondary | ICD-10-CM | POA: Diagnosis not present

## 2020-10-21 DIAGNOSIS — N390 Urinary tract infection, site not specified: Secondary | ICD-10-CM | POA: Diagnosis not present

## 2020-10-21 DIAGNOSIS — Z299 Encounter for prophylactic measures, unspecified: Secondary | ICD-10-CM | POA: Diagnosis not present

## 2020-11-01 DIAGNOSIS — Z5111 Encounter for antineoplastic chemotherapy: Secondary | ICD-10-CM | POA: Diagnosis not present

## 2020-11-01 DIAGNOSIS — R530 Neoplastic (malignant) related fatigue: Secondary | ICD-10-CM | POA: Diagnosis not present

## 2020-11-01 DIAGNOSIS — T451X5D Adverse effect of antineoplastic and immunosuppressive drugs, subsequent encounter: Secondary | ICD-10-CM | POA: Diagnosis not present

## 2020-11-01 DIAGNOSIS — Z192 Hormone resistant malignancy status: Secondary | ICD-10-CM | POA: Diagnosis not present

## 2020-11-01 DIAGNOSIS — R918 Other nonspecific abnormal finding of lung field: Secondary | ICD-10-CM | POA: Diagnosis not present

## 2020-11-01 DIAGNOSIS — Z79899 Other long term (current) drug therapy: Secondary | ICD-10-CM | POA: Diagnosis not present

## 2020-11-01 DIAGNOSIS — N136 Pyonephrosis: Secondary | ICD-10-CM | POA: Diagnosis not present

## 2020-11-01 DIAGNOSIS — Z87440 Personal history of urinary (tract) infections: Secondary | ICD-10-CM | POA: Diagnosis not present

## 2020-11-01 DIAGNOSIS — Z936 Other artificial openings of urinary tract status: Secondary | ICD-10-CM | POA: Diagnosis not present

## 2020-11-01 DIAGNOSIS — G620 Drug-induced polyneuropathy: Secondary | ICD-10-CM | POA: Diagnosis not present

## 2020-11-07 DIAGNOSIS — I10 Essential (primary) hypertension: Secondary | ICD-10-CM | POA: Diagnosis not present

## 2020-11-07 DIAGNOSIS — C7951 Secondary malignant neoplasm of bone: Secondary | ICD-10-CM | POA: Diagnosis not present

## 2020-11-07 DIAGNOSIS — I714 Abdominal aortic aneurysm, without rupture: Secondary | ICD-10-CM | POA: Diagnosis not present

## 2020-11-07 DIAGNOSIS — Z299 Encounter for prophylactic measures, unspecified: Secondary | ICD-10-CM | POA: Diagnosis not present

## 2020-11-07 DIAGNOSIS — Z87891 Personal history of nicotine dependence: Secondary | ICD-10-CM | POA: Diagnosis not present

## 2020-11-11 DIAGNOSIS — Z466 Encounter for fitting and adjustment of urinary device: Secondary | ICD-10-CM | POA: Diagnosis not present

## 2020-11-22 DIAGNOSIS — R609 Edema, unspecified: Secondary | ICD-10-CM | POA: Diagnosis not present

## 2020-11-22 DIAGNOSIS — G620 Drug-induced polyneuropathy: Secondary | ICD-10-CM | POA: Diagnosis not present

## 2020-11-22 DIAGNOSIS — N136 Pyonephrosis: Secondary | ICD-10-CM | POA: Diagnosis not present

## 2020-11-22 DIAGNOSIS — Z192 Hormone resistant malignancy status: Secondary | ICD-10-CM | POA: Diagnosis not present

## 2020-11-22 DIAGNOSIS — T451X5D Adverse effect of antineoplastic and immunosuppressive drugs, subsequent encounter: Secondary | ICD-10-CM | POA: Diagnosis not present

## 2020-11-22 DIAGNOSIS — Z79899 Other long term (current) drug therapy: Secondary | ICD-10-CM | POA: Diagnosis not present

## 2020-11-22 DIAGNOSIS — R5383 Other fatigue: Secondary | ICD-10-CM | POA: Diagnosis not present

## 2020-11-22 DIAGNOSIS — T451X5A Adverse effect of antineoplastic and immunosuppressive drugs, initial encounter: Secondary | ICD-10-CM | POA: Diagnosis not present

## 2020-11-25 DIAGNOSIS — N390 Urinary tract infection, site not specified: Secondary | ICD-10-CM | POA: Diagnosis not present

## 2020-11-25 DIAGNOSIS — I10 Essential (primary) hypertension: Secondary | ICD-10-CM | POA: Diagnosis not present

## 2020-11-25 DIAGNOSIS — Z299 Encounter for prophylactic measures, unspecified: Secondary | ICD-10-CM | POA: Diagnosis not present

## 2020-11-25 DIAGNOSIS — D849 Immunodeficiency, unspecified: Secondary | ICD-10-CM | POA: Diagnosis not present

## 2020-11-30 DIAGNOSIS — I77810 Thoracic aortic ectasia: Secondary | ICD-10-CM | POA: Diagnosis not present

## 2020-11-30 DIAGNOSIS — N390 Urinary tract infection, site not specified: Secondary | ICD-10-CM | POA: Diagnosis not present

## 2020-11-30 DIAGNOSIS — I10 Essential (primary) hypertension: Secondary | ICD-10-CM | POA: Diagnosis not present

## 2020-11-30 DIAGNOSIS — Z299 Encounter for prophylactic measures, unspecified: Secondary | ICD-10-CM | POA: Diagnosis not present

## 2020-11-30 DIAGNOSIS — I714 Abdominal aortic aneurysm, without rupture: Secondary | ICD-10-CM | POA: Diagnosis not present

## 2020-12-01 DIAGNOSIS — C7951 Secondary malignant neoplasm of bone: Secondary | ICD-10-CM | POA: Diagnosis not present

## 2020-12-01 DIAGNOSIS — N390 Urinary tract infection, site not specified: Secondary | ICD-10-CM | POA: Diagnosis not present

## 2020-12-01 DIAGNOSIS — I10 Essential (primary) hypertension: Secondary | ICD-10-CM | POA: Diagnosis not present

## 2020-12-01 DIAGNOSIS — Z299 Encounter for prophylactic measures, unspecified: Secondary | ICD-10-CM | POA: Diagnosis not present

## 2020-12-01 DIAGNOSIS — B965 Pseudomonas (aeruginosa) (mallei) (pseudomallei) as the cause of diseases classified elsewhere: Secondary | ICD-10-CM | POA: Diagnosis not present

## 2020-12-02 DIAGNOSIS — B965 Pseudomonas (aeruginosa) (mallei) (pseudomallei) as the cause of diseases classified elsewhere: Secondary | ICD-10-CM | POA: Diagnosis not present

## 2020-12-02 DIAGNOSIS — I10 Essential (primary) hypertension: Secondary | ICD-10-CM | POA: Diagnosis not present

## 2020-12-02 DIAGNOSIS — N390 Urinary tract infection, site not specified: Secondary | ICD-10-CM | POA: Diagnosis not present

## 2020-12-02 DIAGNOSIS — Z299 Encounter for prophylactic measures, unspecified: Secondary | ICD-10-CM | POA: Diagnosis not present

## 2020-12-15 DIAGNOSIS — C7801 Secondary malignant neoplasm of right lung: Secondary | ICD-10-CM | POA: Diagnosis not present

## 2020-12-15 DIAGNOSIS — T451X5D Adverse effect of antineoplastic and immunosuppressive drugs, subsequent encounter: Secondary | ICD-10-CM | POA: Diagnosis not present

## 2020-12-15 DIAGNOSIS — G620 Drug-induced polyneuropathy: Secondary | ICD-10-CM | POA: Diagnosis not present

## 2020-12-15 DIAGNOSIS — Z192 Hormone resistant malignancy status: Secondary | ICD-10-CM | POA: Diagnosis not present

## 2020-12-15 DIAGNOSIS — C7802 Secondary malignant neoplasm of left lung: Secondary | ICD-10-CM | POA: Diagnosis not present

## 2020-12-15 DIAGNOSIS — Z936 Other artificial openings of urinary tract status: Secondary | ICD-10-CM | POA: Diagnosis not present

## 2020-12-15 DIAGNOSIS — R5383 Other fatigue: Secondary | ICD-10-CM | POA: Diagnosis not present

## 2020-12-15 DIAGNOSIS — Z5111 Encounter for antineoplastic chemotherapy: Secondary | ICD-10-CM | POA: Diagnosis not present

## 2020-12-28 DIAGNOSIS — Z299 Encounter for prophylactic measures, unspecified: Secondary | ICD-10-CM | POA: Diagnosis not present

## 2020-12-28 DIAGNOSIS — I77810 Thoracic aortic ectasia: Secondary | ICD-10-CM | POA: Diagnosis not present

## 2020-12-28 DIAGNOSIS — I10 Essential (primary) hypertension: Secondary | ICD-10-CM | POA: Diagnosis not present

## 2020-12-28 DIAGNOSIS — D6869 Other thrombophilia: Secondary | ICD-10-CM | POA: Diagnosis not present

## 2020-12-28 DIAGNOSIS — N390 Urinary tract infection, site not specified: Secondary | ICD-10-CM | POA: Diagnosis not present

## 2020-12-28 DIAGNOSIS — I714 Abdominal aortic aneurysm, without rupture: Secondary | ICD-10-CM | POA: Diagnosis not present

## 2021-01-05 DIAGNOSIS — N136 Pyonephrosis: Secondary | ICD-10-CM | POA: Diagnosis not present

## 2021-01-05 DIAGNOSIS — Z936 Other artificial openings of urinary tract status: Secondary | ICD-10-CM | POA: Diagnosis not present

## 2021-01-05 DIAGNOSIS — R5383 Other fatigue: Secondary | ICD-10-CM | POA: Diagnosis not present

## 2021-01-05 DIAGNOSIS — T451X5A Adverse effect of antineoplastic and immunosuppressive drugs, initial encounter: Secondary | ICD-10-CM | POA: Diagnosis not present

## 2021-01-05 DIAGNOSIS — T451X5D Adverse effect of antineoplastic and immunosuppressive drugs, subsequent encounter: Secondary | ICD-10-CM | POA: Diagnosis not present

## 2021-01-05 DIAGNOSIS — G620 Drug-induced polyneuropathy: Secondary | ICD-10-CM | POA: Diagnosis not present

## 2021-01-05 DIAGNOSIS — Z5111 Encounter for antineoplastic chemotherapy: Secondary | ICD-10-CM | POA: Diagnosis not present

## 2021-01-05 DIAGNOSIS — Z192 Hormone resistant malignancy status: Secondary | ICD-10-CM | POA: Diagnosis not present

## 2021-01-05 DIAGNOSIS — Z79899 Other long term (current) drug therapy: Secondary | ICD-10-CM | POA: Diagnosis not present

## 2021-01-26 DIAGNOSIS — Z192 Hormone resistant malignancy status: Secondary | ICD-10-CM | POA: Diagnosis not present

## 2021-01-26 DIAGNOSIS — G620 Drug-induced polyneuropathy: Secondary | ICD-10-CM | POA: Diagnosis not present

## 2021-01-26 DIAGNOSIS — N136 Pyonephrosis: Secondary | ICD-10-CM | POA: Diagnosis not present

## 2021-02-02 DIAGNOSIS — Z192 Hormone resistant malignancy status: Secondary | ICD-10-CM | POA: Diagnosis not present

## 2021-02-02 DIAGNOSIS — Z466 Encounter for fitting and adjustment of urinary device: Secondary | ICD-10-CM | POA: Diagnosis not present

## 2021-02-02 DIAGNOSIS — Z436 Encounter for attention to other artificial openings of urinary tract: Secondary | ICD-10-CM | POA: Diagnosis not present

## 2021-02-02 DIAGNOSIS — C775 Secondary and unspecified malignant neoplasm of intrapelvic lymph nodes: Secondary | ICD-10-CM | POA: Diagnosis not present

## 2021-02-06 DIAGNOSIS — C7951 Secondary malignant neoplasm of bone: Secondary | ICD-10-CM | POA: Diagnosis not present

## 2021-02-06 DIAGNOSIS — R590 Localized enlarged lymph nodes: Secondary | ICD-10-CM | POA: Diagnosis not present

## 2021-02-06 DIAGNOSIS — J90 Pleural effusion, not elsewhere classified: Secondary | ICD-10-CM | POA: Diagnosis not present

## 2021-02-09 DIAGNOSIS — G893 Neoplasm related pain (acute) (chronic): Secondary | ICD-10-CM | POA: Diagnosis not present

## 2021-02-09 DIAGNOSIS — N1330 Unspecified hydronephrosis: Secondary | ICD-10-CM | POA: Diagnosis not present

## 2021-02-09 DIAGNOSIS — D630 Anemia in neoplastic disease: Secondary | ICD-10-CM | POA: Diagnosis not present

## 2021-02-09 DIAGNOSIS — Z87440 Personal history of urinary (tract) infections: Secondary | ICD-10-CM | POA: Diagnosis not present

## 2021-02-09 DIAGNOSIS — C7951 Secondary malignant neoplasm of bone: Secondary | ICD-10-CM | POA: Diagnosis not present

## 2021-02-09 DIAGNOSIS — G620 Drug-induced polyneuropathy: Secondary | ICD-10-CM | POA: Diagnosis not present

## 2021-02-09 DIAGNOSIS — Z192 Hormone resistant malignancy status: Secondary | ICD-10-CM | POA: Diagnosis not present

## 2021-02-09 DIAGNOSIS — M545 Low back pain, unspecified: Secondary | ICD-10-CM | POA: Diagnosis not present

## 2021-02-09 DIAGNOSIS — T451X5D Adverse effect of antineoplastic and immunosuppressive drugs, subsequent encounter: Secondary | ICD-10-CM | POA: Diagnosis not present

## 2021-02-09 DIAGNOSIS — Z79899 Other long term (current) drug therapy: Secondary | ICD-10-CM | POA: Diagnosis not present

## 2021-02-15 DIAGNOSIS — C7951 Secondary malignant neoplasm of bone: Secondary | ICD-10-CM | POA: Diagnosis not present

## 2021-02-15 DIAGNOSIS — Z923 Personal history of irradiation: Secondary | ICD-10-CM | POA: Diagnosis not present

## 2021-02-15 DIAGNOSIS — Z510 Encounter for antineoplastic radiation therapy: Secondary | ICD-10-CM | POA: Diagnosis not present

## 2021-02-21 DIAGNOSIS — Z510 Encounter for antineoplastic radiation therapy: Secondary | ICD-10-CM | POA: Diagnosis not present

## 2021-02-21 DIAGNOSIS — C7951 Secondary malignant neoplasm of bone: Secondary | ICD-10-CM | POA: Diagnosis not present

## 2021-02-23 DIAGNOSIS — Z510 Encounter for antineoplastic radiation therapy: Secondary | ICD-10-CM | POA: Diagnosis not present

## 2021-02-23 DIAGNOSIS — C7951 Secondary malignant neoplasm of bone: Secondary | ICD-10-CM | POA: Diagnosis not present

## 2021-02-24 DIAGNOSIS — Z510 Encounter for antineoplastic radiation therapy: Secondary | ICD-10-CM | POA: Diagnosis not present

## 2021-02-24 DIAGNOSIS — C7951 Secondary malignant neoplasm of bone: Secondary | ICD-10-CM | POA: Diagnosis not present

## 2021-02-27 DIAGNOSIS — Z510 Encounter for antineoplastic radiation therapy: Secondary | ICD-10-CM | POA: Diagnosis not present

## 2021-02-27 DIAGNOSIS — C7951 Secondary malignant neoplasm of bone: Secondary | ICD-10-CM | POA: Diagnosis not present

## 2021-02-28 DIAGNOSIS — C7951 Secondary malignant neoplasm of bone: Secondary | ICD-10-CM | POA: Diagnosis not present

## 2021-02-28 DIAGNOSIS — Z510 Encounter for antineoplastic radiation therapy: Secondary | ICD-10-CM | POA: Diagnosis not present

## 2021-03-01 DIAGNOSIS — Z510 Encounter for antineoplastic radiation therapy: Secondary | ICD-10-CM | POA: Diagnosis not present

## 2021-03-01 DIAGNOSIS — C7951 Secondary malignant neoplasm of bone: Secondary | ICD-10-CM | POA: Diagnosis not present

## 2021-03-02 DIAGNOSIS — R5383 Other fatigue: Secondary | ICD-10-CM | POA: Diagnosis not present

## 2021-03-02 DIAGNOSIS — Z923 Personal history of irradiation: Secondary | ICD-10-CM | POA: Diagnosis not present

## 2021-03-02 DIAGNOSIS — C7951 Secondary malignant neoplasm of bone: Secondary | ICD-10-CM | POA: Diagnosis not present

## 2021-03-02 DIAGNOSIS — Z192 Hormone resistant malignancy status: Secondary | ICD-10-CM | POA: Diagnosis not present

## 2021-03-02 DIAGNOSIS — Z510 Encounter for antineoplastic radiation therapy: Secondary | ICD-10-CM | POA: Diagnosis not present

## 2021-03-02 DIAGNOSIS — G893 Neoplasm related pain (acute) (chronic): Secondary | ICD-10-CM | POA: Diagnosis not present

## 2021-03-03 DIAGNOSIS — Z510 Encounter for antineoplastic radiation therapy: Secondary | ICD-10-CM | POA: Diagnosis not present

## 2021-03-03 DIAGNOSIS — C7951 Secondary malignant neoplasm of bone: Secondary | ICD-10-CM | POA: Diagnosis not present

## 2021-03-06 DIAGNOSIS — C7951 Secondary malignant neoplasm of bone: Secondary | ICD-10-CM | POA: Diagnosis not present

## 2021-03-06 DIAGNOSIS — N390 Urinary tract infection, site not specified: Secondary | ICD-10-CM | POA: Diagnosis not present

## 2021-03-06 DIAGNOSIS — I10 Essential (primary) hypertension: Secondary | ICD-10-CM | POA: Diagnosis not present

## 2021-03-06 DIAGNOSIS — Z299 Encounter for prophylactic measures, unspecified: Secondary | ICD-10-CM | POA: Diagnosis not present

## 2021-03-06 DIAGNOSIS — Z936 Other artificial openings of urinary tract status: Secondary | ICD-10-CM | POA: Diagnosis not present

## 2021-03-06 DIAGNOSIS — Z510 Encounter for antineoplastic radiation therapy: Secondary | ICD-10-CM | POA: Diagnosis not present

## 2021-03-07 DIAGNOSIS — C7951 Secondary malignant neoplasm of bone: Secondary | ICD-10-CM | POA: Diagnosis not present

## 2021-03-07 DIAGNOSIS — Z510 Encounter for antineoplastic radiation therapy: Secondary | ICD-10-CM | POA: Diagnosis not present

## 2021-03-08 DIAGNOSIS — Z510 Encounter for antineoplastic radiation therapy: Secondary | ICD-10-CM | POA: Diagnosis not present

## 2021-03-08 DIAGNOSIS — C7951 Secondary malignant neoplasm of bone: Secondary | ICD-10-CM | POA: Diagnosis not present

## 2021-03-09 DIAGNOSIS — Z510 Encounter for antineoplastic radiation therapy: Secondary | ICD-10-CM | POA: Diagnosis not present

## 2021-03-09 DIAGNOSIS — C7951 Secondary malignant neoplasm of bone: Secondary | ICD-10-CM | POA: Diagnosis not present

## 2021-03-12 DIAGNOSIS — C7951 Secondary malignant neoplasm of bone: Secondary | ICD-10-CM | POA: Diagnosis not present

## 2021-03-12 DIAGNOSIS — K449 Diaphragmatic hernia without obstruction or gangrene: Secondary | ICD-10-CM | POA: Diagnosis not present

## 2021-03-12 DIAGNOSIS — Z87891 Personal history of nicotine dependence: Secondary | ICD-10-CM | POA: Diagnosis not present

## 2021-03-12 DIAGNOSIS — Z87440 Personal history of urinary (tract) infections: Secondary | ICD-10-CM | POA: Diagnosis not present

## 2021-03-12 DIAGNOSIS — G620 Drug-induced polyneuropathy: Secondary | ICD-10-CM | POA: Diagnosis not present

## 2021-03-12 DIAGNOSIS — J90 Pleural effusion, not elsewhere classified: Secondary | ICD-10-CM | POA: Diagnosis not present

## 2021-03-12 DIAGNOSIS — N200 Calculus of kidney: Secondary | ICD-10-CM | POA: Diagnosis not present

## 2021-03-12 DIAGNOSIS — D638 Anemia in other chronic diseases classified elsewhere: Secondary | ICD-10-CM | POA: Diagnosis not present

## 2021-03-12 DIAGNOSIS — R591 Generalized enlarged lymph nodes: Secondary | ICD-10-CM | POA: Diagnosis not present

## 2021-03-12 DIAGNOSIS — Z936 Other artificial openings of urinary tract status: Secondary | ICD-10-CM | POA: Diagnosis not present

## 2021-03-12 DIAGNOSIS — N390 Urinary tract infection, site not specified: Secondary | ICD-10-CM | POA: Diagnosis not present

## 2021-03-12 DIAGNOSIS — K219 Gastro-esophageal reflux disease without esophagitis: Secondary | ICD-10-CM | POA: Diagnosis not present

## 2021-03-12 DIAGNOSIS — E785 Hyperlipidemia, unspecified: Secondary | ICD-10-CM | POA: Diagnosis not present

## 2021-03-12 DIAGNOSIS — Z79899 Other long term (current) drug therapy: Secondary | ICD-10-CM | POA: Diagnosis not present

## 2021-03-12 DIAGNOSIS — T451X5A Adverse effect of antineoplastic and immunosuppressive drugs, initial encounter: Secondary | ICD-10-CM | POA: Diagnosis not present

## 2021-03-12 DIAGNOSIS — N12 Tubulo-interstitial nephritis, not specified as acute or chronic: Secondary | ICD-10-CM | POA: Diagnosis not present

## 2021-03-13 DIAGNOSIS — T451X5A Adverse effect of antineoplastic and immunosuppressive drugs, initial encounter: Secondary | ICD-10-CM | POA: Diagnosis not present

## 2021-03-13 DIAGNOSIS — Z936 Other artificial openings of urinary tract status: Secondary | ICD-10-CM | POA: Diagnosis not present

## 2021-03-13 DIAGNOSIS — N12 Tubulo-interstitial nephritis, not specified as acute or chronic: Secondary | ICD-10-CM | POA: Diagnosis not present

## 2021-03-13 DIAGNOSIS — R0602 Shortness of breath: Secondary | ICD-10-CM | POA: Diagnosis not present

## 2021-03-13 DIAGNOSIS — Z192 Hormone resistant malignancy status: Secondary | ICD-10-CM | POA: Diagnosis not present

## 2021-03-13 DIAGNOSIS — J90 Pleural effusion, not elsewhere classified: Secondary | ICD-10-CM | POA: Diagnosis not present

## 2021-03-13 DIAGNOSIS — G620 Drug-induced polyneuropathy: Secondary | ICD-10-CM | POA: Diagnosis not present

## 2021-03-13 DIAGNOSIS — N390 Urinary tract infection, site not specified: Secondary | ICD-10-CM | POA: Diagnosis not present

## 2021-03-13 DIAGNOSIS — E785 Hyperlipidemia, unspecified: Secondary | ICD-10-CM | POA: Diagnosis not present

## 2021-03-14 DIAGNOSIS — D638 Anemia in other chronic diseases classified elsewhere: Secondary | ICD-10-CM | POA: Diagnosis not present

## 2021-03-14 DIAGNOSIS — J90 Pleural effusion, not elsewhere classified: Secondary | ICD-10-CM | POA: Diagnosis not present

## 2021-03-14 DIAGNOSIS — N12 Tubulo-interstitial nephritis, not specified as acute or chronic: Secondary | ICD-10-CM | POA: Diagnosis not present

## 2021-03-14 DIAGNOSIS — R0600 Dyspnea, unspecified: Secondary | ICD-10-CM | POA: Diagnosis not present

## 2021-03-14 DIAGNOSIS — N390 Urinary tract infection, site not specified: Secondary | ICD-10-CM | POA: Diagnosis not present

## 2021-03-14 DIAGNOSIS — Z936 Other artificial openings of urinary tract status: Secondary | ICD-10-CM | POA: Diagnosis not present

## 2021-03-23 DIAGNOSIS — T451X5A Adverse effect of antineoplastic and immunosuppressive drugs, initial encounter: Secondary | ICD-10-CM | POA: Diagnosis not present

## 2021-03-23 DIAGNOSIS — C7951 Secondary malignant neoplasm of bone: Secondary | ICD-10-CM | POA: Diagnosis not present

## 2021-03-23 DIAGNOSIS — G893 Neoplasm related pain (acute) (chronic): Secondary | ICD-10-CM | POA: Diagnosis not present

## 2021-03-23 DIAGNOSIS — Z936 Other artificial openings of urinary tract status: Secondary | ICD-10-CM | POA: Diagnosis not present

## 2021-03-23 DIAGNOSIS — Z79899 Other long term (current) drug therapy: Secondary | ICD-10-CM | POA: Diagnosis not present

## 2021-03-23 DIAGNOSIS — G620 Drug-induced polyneuropathy: Secondary | ICD-10-CM | POA: Diagnosis not present

## 2021-03-23 DIAGNOSIS — Z923 Personal history of irradiation: Secondary | ICD-10-CM | POA: Diagnosis not present

## 2021-03-23 DIAGNOSIS — G629 Polyneuropathy, unspecified: Secondary | ICD-10-CM | POA: Diagnosis not present

## 2021-03-23 DIAGNOSIS — Z9221 Personal history of antineoplastic chemotherapy: Secondary | ICD-10-CM | POA: Diagnosis not present

## 2021-03-23 DIAGNOSIS — Z192 Hormone resistant malignancy status: Secondary | ICD-10-CM | POA: Diagnosis not present

## 2021-03-26 DIAGNOSIS — R112 Nausea with vomiting, unspecified: Secondary | ICD-10-CM | POA: Diagnosis not present

## 2021-03-26 DIAGNOSIS — Z515 Encounter for palliative care: Secondary | ICD-10-CM | POA: Diagnosis not present

## 2021-03-26 DIAGNOSIS — R509 Fever, unspecified: Secondary | ICD-10-CM | POA: Diagnosis not present

## 2021-03-26 DIAGNOSIS — Z87440 Personal history of urinary (tract) infections: Secondary | ICD-10-CM | POA: Diagnosis not present

## 2021-03-26 DIAGNOSIS — R0602 Shortness of breath: Secondary | ICD-10-CM | POA: Diagnosis not present

## 2021-03-26 DIAGNOSIS — A419 Sepsis, unspecified organism: Secondary | ICD-10-CM | POA: Diagnosis not present

## 2021-03-26 DIAGNOSIS — J9601 Acute respiratory failure with hypoxia: Secondary | ICD-10-CM | POA: Diagnosis not present

## 2021-03-26 DIAGNOSIS — Z923 Personal history of irradiation: Secondary | ICD-10-CM | POA: Diagnosis not present

## 2021-03-26 DIAGNOSIS — T451X5A Adverse effect of antineoplastic and immunosuppressive drugs, initial encounter: Secondary | ICD-10-CM | POA: Diagnosis not present

## 2021-03-26 DIAGNOSIS — Z20822 Contact with and (suspected) exposure to covid-19: Secondary | ICD-10-CM | POA: Diagnosis not present

## 2021-03-26 DIAGNOSIS — J910 Malignant pleural effusion: Secondary | ICD-10-CM | POA: Diagnosis not present

## 2021-03-26 DIAGNOSIS — R918 Other nonspecific abnormal finding of lung field: Secondary | ICD-10-CM | POA: Diagnosis not present

## 2021-03-26 DIAGNOSIS — R0600 Dyspnea, unspecified: Secondary | ICD-10-CM | POA: Diagnosis not present

## 2021-03-26 DIAGNOSIS — J90 Pleural effusion, not elsewhere classified: Secondary | ICD-10-CM | POA: Diagnosis not present

## 2021-03-26 DIAGNOSIS — C782 Secondary malignant neoplasm of pleura: Secondary | ICD-10-CM | POA: Diagnosis not present

## 2021-03-26 DIAGNOSIS — Z936 Other artificial openings of urinary tract status: Secondary | ICD-10-CM | POA: Diagnosis not present

## 2021-03-26 DIAGNOSIS — E876 Hypokalemia: Secondary | ICD-10-CM | POA: Diagnosis not present

## 2021-03-26 DIAGNOSIS — G893 Neoplasm related pain (acute) (chronic): Secondary | ICD-10-CM | POA: Diagnosis not present

## 2021-03-26 DIAGNOSIS — G620 Drug-induced polyneuropathy: Secondary | ICD-10-CM | POA: Diagnosis not present

## 2021-03-26 DIAGNOSIS — C7951 Secondary malignant neoplasm of bone: Secondary | ICD-10-CM | POA: Diagnosis not present

## 2021-03-27 DIAGNOSIS — Z515 Encounter for palliative care: Secondary | ICD-10-CM | POA: Diagnosis not present

## 2021-03-27 DIAGNOSIS — R509 Fever, unspecified: Secondary | ICD-10-CM | POA: Diagnosis not present

## 2021-03-28 DIAGNOSIS — R918 Other nonspecific abnormal finding of lung field: Secondary | ICD-10-CM | POA: Diagnosis not present

## 2021-03-28 DIAGNOSIS — R509 Fever, unspecified: Secondary | ICD-10-CM | POA: Diagnosis not present

## 2021-03-28 DIAGNOSIS — E876 Hypokalemia: Secondary | ICD-10-CM | POA: Diagnosis not present

## 2021-03-28 DIAGNOSIS — Z20822 Contact with and (suspected) exposure to covid-19: Secondary | ICD-10-CM | POA: Diagnosis not present

## 2021-03-28 DIAGNOSIS — J90 Pleural effusion, not elsewhere classified: Secondary | ICD-10-CM | POA: Diagnosis not present

## 2021-03-28 DIAGNOSIS — Z515 Encounter for palliative care: Secondary | ICD-10-CM | POA: Diagnosis not present

## 2021-03-28 DIAGNOSIS — R0600 Dyspnea, unspecified: Secondary | ICD-10-CM | POA: Diagnosis not present

## 2021-04-05 DIAGNOSIS — J9611 Chronic respiratory failure with hypoxia: Secondary | ICD-10-CM | POA: Diagnosis not present

## 2021-04-05 DIAGNOSIS — N390 Urinary tract infection, site not specified: Secondary | ICD-10-CM | POA: Diagnosis not present

## 2021-04-05 DIAGNOSIS — I10 Essential (primary) hypertension: Secondary | ICD-10-CM | POA: Diagnosis not present

## 2021-04-05 DIAGNOSIS — Z299 Encounter for prophylactic measures, unspecified: Secondary | ICD-10-CM | POA: Diagnosis not present

## 2021-04-05 DIAGNOSIS — Z09 Encounter for follow-up examination after completed treatment for conditions other than malignant neoplasm: Secondary | ICD-10-CM | POA: Diagnosis not present

## 2021-04-10 DIAGNOSIS — Z299 Encounter for prophylactic measures, unspecified: Secondary | ICD-10-CM | POA: Diagnosis not present

## 2021-04-10 DIAGNOSIS — R5383 Other fatigue: Secondary | ICD-10-CM | POA: Diagnosis not present

## 2021-04-10 DIAGNOSIS — Z0000 Encounter for general adult medical examination without abnormal findings: Secondary | ICD-10-CM | POA: Diagnosis not present

## 2021-04-10 DIAGNOSIS — I10 Essential (primary) hypertension: Secondary | ICD-10-CM | POA: Diagnosis not present

## 2021-04-10 DIAGNOSIS — Z7189 Other specified counseling: Secondary | ICD-10-CM | POA: Diagnosis not present

## 2021-04-10 DIAGNOSIS — N390 Urinary tract infection, site not specified: Secondary | ICD-10-CM | POA: Diagnosis not present

## 2021-04-11 DIAGNOSIS — R5383 Other fatigue: Secondary | ICD-10-CM | POA: Diagnosis not present

## 2021-04-11 DIAGNOSIS — E7800 Pure hypercholesterolemia, unspecified: Secondary | ICD-10-CM | POA: Diagnosis not present

## 2021-04-11 DIAGNOSIS — Z936 Other artificial openings of urinary tract status: Secondary | ICD-10-CM | POA: Diagnosis not present

## 2021-04-11 DIAGNOSIS — Z79899 Other long term (current) drug therapy: Secondary | ICD-10-CM | POA: Diagnosis not present

## 2021-04-11 DIAGNOSIS — N390 Urinary tract infection, site not specified: Secondary | ICD-10-CM | POA: Diagnosis not present

## 2021-04-11 DIAGNOSIS — Z299 Encounter for prophylactic measures, unspecified: Secondary | ICD-10-CM | POA: Diagnosis not present

## 2021-04-11 DIAGNOSIS — I10 Essential (primary) hypertension: Secondary | ICD-10-CM | POA: Diagnosis not present

## 2021-04-12 DIAGNOSIS — Z192 Hormone resistant malignancy status: Secondary | ICD-10-CM | POA: Diagnosis not present

## 2021-04-12 DIAGNOSIS — N390 Urinary tract infection, site not specified: Secondary | ICD-10-CM | POA: Diagnosis not present

## 2021-04-12 DIAGNOSIS — Z299 Encounter for prophylactic measures, unspecified: Secondary | ICD-10-CM | POA: Diagnosis not present

## 2021-04-12 DIAGNOSIS — I10 Essential (primary) hypertension: Secondary | ICD-10-CM | POA: Diagnosis not present

## 2021-04-14 DIAGNOSIS — Z192 Hormone resistant malignancy status: Secondary | ICD-10-CM | POA: Diagnosis not present

## 2021-04-14 DIAGNOSIS — N390 Urinary tract infection, site not specified: Secondary | ICD-10-CM | POA: Diagnosis not present

## 2021-04-14 DIAGNOSIS — I10 Essential (primary) hypertension: Secondary | ICD-10-CM | POA: Diagnosis not present

## 2021-04-14 DIAGNOSIS — D849 Immunodeficiency, unspecified: Secondary | ICD-10-CM | POA: Diagnosis not present

## 2021-04-14 DIAGNOSIS — Z299 Encounter for prophylactic measures, unspecified: Secondary | ICD-10-CM | POA: Diagnosis not present

## 2021-04-18 DIAGNOSIS — J9611 Chronic respiratory failure with hypoxia: Secondary | ICD-10-CM | POA: Diagnosis not present

## 2021-04-18 DIAGNOSIS — I10 Essential (primary) hypertension: Secondary | ICD-10-CM | POA: Diagnosis not present

## 2021-04-18 DIAGNOSIS — J069 Acute upper respiratory infection, unspecified: Secondary | ICD-10-CM | POA: Diagnosis not present

## 2021-04-18 DIAGNOSIS — Z299 Encounter for prophylactic measures, unspecified: Secondary | ICD-10-CM | POA: Diagnosis not present

## 2021-04-21 DIAGNOSIS — Z466 Encounter for fitting and adjustment of urinary device: Secondary | ICD-10-CM | POA: Diagnosis not present

## 2021-04-25 DIAGNOSIS — Z299 Encounter for prophylactic measures, unspecified: Secondary | ICD-10-CM | POA: Diagnosis not present

## 2021-04-25 DIAGNOSIS — J9811 Atelectasis: Secondary | ICD-10-CM | POA: Diagnosis not present

## 2021-04-25 DIAGNOSIS — J90 Pleural effusion, not elsewhere classified: Secondary | ICD-10-CM | POA: Diagnosis not present

## 2021-04-25 DIAGNOSIS — D6869 Other thrombophilia: Secondary | ICD-10-CM | POA: Diagnosis not present

## 2021-04-25 DIAGNOSIS — N390 Urinary tract infection, site not specified: Secondary | ICD-10-CM | POA: Diagnosis not present

## 2021-04-25 DIAGNOSIS — J069 Acute upper respiratory infection, unspecified: Secondary | ICD-10-CM | POA: Diagnosis not present

## 2021-04-25 DIAGNOSIS — R0602 Shortness of breath: Secondary | ICD-10-CM | POA: Diagnosis not present

## 2021-04-25 DIAGNOSIS — R059 Cough, unspecified: Secondary | ICD-10-CM | POA: Diagnosis not present

## 2021-04-25 DIAGNOSIS — R5383 Other fatigue: Secondary | ICD-10-CM | POA: Diagnosis not present

## 2021-04-26 DIAGNOSIS — I10 Essential (primary) hypertension: Secondary | ICD-10-CM | POA: Diagnosis not present

## 2021-04-26 DIAGNOSIS — Z299 Encounter for prophylactic measures, unspecified: Secondary | ICD-10-CM | POA: Diagnosis not present

## 2021-04-26 DIAGNOSIS — N390 Urinary tract infection, site not specified: Secondary | ICD-10-CM | POA: Diagnosis not present

## 2021-04-26 DIAGNOSIS — J90 Pleural effusion, not elsewhere classified: Secondary | ICD-10-CM | POA: Diagnosis not present

## 2021-04-27 DIAGNOSIS — G893 Neoplasm related pain (acute) (chronic): Secondary | ICD-10-CM | POA: Diagnosis not present

## 2021-04-27 DIAGNOSIS — J910 Malignant pleural effusion: Secondary | ICD-10-CM | POA: Diagnosis not present

## 2021-04-27 DIAGNOSIS — R531 Weakness: Secondary | ICD-10-CM | POA: Diagnosis not present

## 2021-04-27 DIAGNOSIS — Z192 Hormone resistant malignancy status: Secondary | ICD-10-CM | POA: Diagnosis not present

## 2021-04-27 DIAGNOSIS — C7951 Secondary malignant neoplasm of bone: Secondary | ICD-10-CM | POA: Diagnosis not present

## 2021-04-27 DIAGNOSIS — R5381 Other malaise: Secondary | ICD-10-CM | POA: Diagnosis not present

## 2021-04-27 DIAGNOSIS — G620 Drug-induced polyneuropathy: Secondary | ICD-10-CM | POA: Diagnosis not present

## 2021-04-28 DIAGNOSIS — J910 Malignant pleural effusion: Secondary | ICD-10-CM | POA: Diagnosis not present

## 2021-04-28 DIAGNOSIS — J90 Pleural effusion, not elsewhere classified: Secondary | ICD-10-CM | POA: Diagnosis not present

## 2021-05-07 DIAGNOSIS — I10 Essential (primary) hypertension: Secondary | ICD-10-CM | POA: Diagnosis not present

## 2021-05-07 DIAGNOSIS — J90 Pleural effusion, not elsewhere classified: Secondary | ICD-10-CM | POA: Diagnosis not present

## 2021-05-07 DIAGNOSIS — J9621 Acute and chronic respiratory failure with hypoxia: Secondary | ICD-10-CM | POA: Diagnosis not present

## 2021-05-07 DIAGNOSIS — C782 Secondary malignant neoplasm of pleura: Secondary | ICD-10-CM | POA: Diagnosis not present

## 2021-05-07 DIAGNOSIS — Z87891 Personal history of nicotine dependence: Secondary | ICD-10-CM | POA: Diagnosis not present

## 2021-05-07 DIAGNOSIS — I491 Atrial premature depolarization: Secondary | ICD-10-CM | POA: Diagnosis not present

## 2021-05-07 DIAGNOSIS — I4510 Unspecified right bundle-branch block: Secondary | ICD-10-CM | POA: Diagnosis not present

## 2021-05-07 DIAGNOSIS — Z923 Personal history of irradiation: Secondary | ICD-10-CM | POA: Diagnosis not present

## 2021-05-07 DIAGNOSIS — G629 Polyneuropathy, unspecified: Secondary | ICD-10-CM | POA: Diagnosis not present

## 2021-05-07 DIAGNOSIS — J910 Malignant pleural effusion: Secondary | ICD-10-CM | POA: Diagnosis not present

## 2021-05-07 DIAGNOSIS — C7951 Secondary malignant neoplasm of bone: Secondary | ICD-10-CM | POA: Diagnosis not present

## 2021-05-07 DIAGNOSIS — R0602 Shortness of breath: Secondary | ICD-10-CM | POA: Diagnosis not present

## 2021-05-08 DIAGNOSIS — J9621 Acute and chronic respiratory failure with hypoxia: Secondary | ICD-10-CM | POA: Diagnosis not present

## 2021-05-08 DIAGNOSIS — J90 Pleural effusion, not elsewhere classified: Secondary | ICD-10-CM | POA: Diagnosis not present

## 2021-05-08 DIAGNOSIS — R0602 Shortness of breath: Secondary | ICD-10-CM | POA: Diagnosis not present

## 2021-05-08 DIAGNOSIS — Z87891 Personal history of nicotine dependence: Secondary | ICD-10-CM | POA: Diagnosis not present

## 2021-05-08 DIAGNOSIS — C7951 Secondary malignant neoplasm of bone: Secondary | ICD-10-CM | POA: Diagnosis not present

## 2021-05-08 DIAGNOSIS — I10 Essential (primary) hypertension: Secondary | ICD-10-CM | POA: Diagnosis not present

## 2021-05-08 DIAGNOSIS — J910 Malignant pleural effusion: Secondary | ICD-10-CM | POA: Diagnosis not present

## 2021-05-08 DIAGNOSIS — R1110 Vomiting, unspecified: Secondary | ICD-10-CM | POA: Diagnosis not present

## 2021-05-08 DIAGNOSIS — C782 Secondary malignant neoplasm of pleura: Secondary | ICD-10-CM | POA: Diagnosis not present

## 2021-05-08 DIAGNOSIS — R0682 Tachypnea, not elsewhere classified: Secondary | ICD-10-CM | POA: Diagnosis not present

## 2021-05-08 DIAGNOSIS — E785 Hyperlipidemia, unspecified: Secondary | ICD-10-CM | POA: Diagnosis not present

## 2021-05-08 DIAGNOSIS — R000 Tachycardia, unspecified: Secondary | ICD-10-CM | POA: Diagnosis not present

## 2021-05-08 DIAGNOSIS — Z923 Personal history of irradiation: Secondary | ICD-10-CM | POA: Diagnosis not present

## 2021-05-09 DIAGNOSIS — Z9981 Dependence on supplemental oxygen: Secondary | ICD-10-CM | POA: Diagnosis not present

## 2021-05-09 DIAGNOSIS — Z923 Personal history of irradiation: Secondary | ICD-10-CM | POA: Diagnosis not present

## 2021-05-09 DIAGNOSIS — Z87891 Personal history of nicotine dependence: Secondary | ICD-10-CM | POA: Diagnosis not present

## 2021-05-09 DIAGNOSIS — C7951 Secondary malignant neoplasm of bone: Secondary | ICD-10-CM | POA: Diagnosis not present

## 2021-05-09 DIAGNOSIS — J90 Pleural effusion, not elsewhere classified: Secondary | ICD-10-CM | POA: Diagnosis not present

## 2021-05-09 DIAGNOSIS — J9621 Acute and chronic respiratory failure with hypoxia: Secondary | ICD-10-CM | POA: Diagnosis not present

## 2021-05-09 DIAGNOSIS — I10 Essential (primary) hypertension: Secondary | ICD-10-CM | POA: Diagnosis not present

## 2021-05-09 DIAGNOSIS — C782 Secondary malignant neoplasm of pleura: Secondary | ICD-10-CM | POA: Diagnosis not present

## 2021-05-09 DIAGNOSIS — J910 Malignant pleural effusion: Secondary | ICD-10-CM | POA: Diagnosis not present

## 2021-05-09 DIAGNOSIS — C7982 Secondary malignant neoplasm of genital organs: Secondary | ICD-10-CM | POA: Diagnosis not present

## 2021-05-10 DIAGNOSIS — C7951 Secondary malignant neoplasm of bone: Secondary | ICD-10-CM | POA: Diagnosis not present

## 2021-05-10 DIAGNOSIS — R531 Weakness: Secondary | ICD-10-CM | POA: Diagnosis not present

## 2021-05-10 DIAGNOSIS — J910 Malignant pleural effusion: Secondary | ICD-10-CM | POA: Diagnosis not present

## 2021-05-11 DIAGNOSIS — Z936 Other artificial openings of urinary tract status: Secondary | ICD-10-CM | POA: Diagnosis not present

## 2021-05-11 DIAGNOSIS — Z192 Hormone resistant malignancy status: Secondary | ICD-10-CM | POA: Diagnosis not present

## 2021-05-11 DIAGNOSIS — T451X5D Adverse effect of antineoplastic and immunosuppressive drugs, subsequent encounter: Secondary | ICD-10-CM | POA: Diagnosis not present

## 2021-05-11 DIAGNOSIS — J910 Malignant pleural effusion: Secondary | ICD-10-CM | POA: Diagnosis not present

## 2021-05-11 DIAGNOSIS — G893 Neoplasm related pain (acute) (chronic): Secondary | ICD-10-CM | POA: Diagnosis not present

## 2021-05-11 DIAGNOSIS — G620 Drug-induced polyneuropathy: Secondary | ICD-10-CM | POA: Diagnosis not present

## 2021-05-11 DIAGNOSIS — J9690 Respiratory failure, unspecified, unspecified whether with hypoxia or hypercapnia: Secondary | ICD-10-CM | POA: Diagnosis not present

## 2021-05-11 DIAGNOSIS — C7951 Secondary malignant neoplasm of bone: Secondary | ICD-10-CM | POA: Diagnosis not present

## 2021-05-16 DIAGNOSIS — J9611 Chronic respiratory failure with hypoxia: Secondary | ICD-10-CM | POA: Diagnosis not present

## 2021-05-16 DIAGNOSIS — Z09 Encounter for follow-up examination after completed treatment for conditions other than malignant neoplasm: Secondary | ICD-10-CM | POA: Diagnosis not present

## 2021-05-16 DIAGNOSIS — I10 Essential (primary) hypertension: Secondary | ICD-10-CM | POA: Diagnosis not present

## 2021-05-16 DIAGNOSIS — I714 Abdominal aortic aneurysm, without rupture: Secondary | ICD-10-CM | POA: Diagnosis not present

## 2021-05-16 DIAGNOSIS — N390 Urinary tract infection, site not specified: Secondary | ICD-10-CM | POA: Diagnosis not present

## 2021-05-16 DIAGNOSIS — Z192 Hormone resistant malignancy status: Secondary | ICD-10-CM | POA: Diagnosis not present

## 2021-05-26 DIAGNOSIS — F17221 Nicotine dependence, chewing tobacco, in remission: Secondary | ICD-10-CM | POA: Diagnosis not present

## 2021-05-26 DIAGNOSIS — J90 Pleural effusion, not elsewhere classified: Secondary | ICD-10-CM | POA: Diagnosis not present

## 2021-05-26 DIAGNOSIS — Z978 Presence of other specified devices: Secondary | ICD-10-CM | POA: Diagnosis not present

## 2021-05-26 DIAGNOSIS — J811 Chronic pulmonary edema: Secondary | ICD-10-CM | POA: Diagnosis not present

## 2021-05-26 DIAGNOSIS — Z936 Other artificial openings of urinary tract status: Secondary | ICD-10-CM | POA: Diagnosis not present

## 2021-05-31 DIAGNOSIS — Z299 Encounter for prophylactic measures, unspecified: Secondary | ICD-10-CM | POA: Diagnosis not present

## 2021-05-31 DIAGNOSIS — N390 Urinary tract infection, site not specified: Secondary | ICD-10-CM | POA: Diagnosis not present

## 2021-05-31 DIAGNOSIS — R110 Nausea: Secondary | ICD-10-CM | POA: Diagnosis not present

## 2021-06-06 DIAGNOSIS — Z299 Encounter for prophylactic measures, unspecified: Secondary | ICD-10-CM | POA: Diagnosis not present

## 2021-06-06 DIAGNOSIS — R112 Nausea with vomiting, unspecified: Secondary | ICD-10-CM | POA: Diagnosis not present

## 2021-06-06 DIAGNOSIS — I10 Essential (primary) hypertension: Secondary | ICD-10-CM | POA: Diagnosis not present

## 2021-06-06 DIAGNOSIS — D239 Other benign neoplasm of skin, unspecified: Secondary | ICD-10-CM | POA: Diagnosis not present

## 2021-06-08 DIAGNOSIS — Z79899 Other long term (current) drug therapy: Secondary | ICD-10-CM | POA: Diagnosis not present

## 2021-06-08 DIAGNOSIS — G893 Neoplasm related pain (acute) (chronic): Secondary | ICD-10-CM | POA: Diagnosis not present

## 2021-06-08 DIAGNOSIS — Z923 Personal history of irradiation: Secondary | ICD-10-CM | POA: Diagnosis not present

## 2021-06-08 DIAGNOSIS — C7951 Secondary malignant neoplasm of bone: Secondary | ICD-10-CM | POA: Diagnosis not present

## 2021-06-08 DIAGNOSIS — Z9221 Personal history of antineoplastic chemotherapy: Secondary | ICD-10-CM | POA: Diagnosis not present

## 2021-06-08 DIAGNOSIS — J9690 Respiratory failure, unspecified, unspecified whether with hypoxia or hypercapnia: Secondary | ICD-10-CM | POA: Diagnosis not present

## 2021-06-08 DIAGNOSIS — G629 Polyneuropathy, unspecified: Secondary | ICD-10-CM | POA: Diagnosis not present

## 2021-06-08 DIAGNOSIS — J910 Malignant pleural effusion: Secondary | ICD-10-CM | POA: Diagnosis not present

## 2021-06-08 DIAGNOSIS — G620 Drug-induced polyneuropathy: Secondary | ICD-10-CM | POA: Diagnosis not present

## 2021-06-08 DIAGNOSIS — Z936 Other artificial openings of urinary tract status: Secondary | ICD-10-CM | POA: Diagnosis not present

## 2021-06-08 DIAGNOSIS — T451X5A Adverse effect of antineoplastic and immunosuppressive drugs, initial encounter: Secondary | ICD-10-CM | POA: Diagnosis not present

## 2021-06-08 DIAGNOSIS — Z192 Hormone resistant malignancy status: Secondary | ICD-10-CM | POA: Diagnosis not present

## 2021-06-10 DIAGNOSIS — J910 Malignant pleural effusion: Secondary | ICD-10-CM | POA: Diagnosis not present

## 2021-06-10 DIAGNOSIS — C7951 Secondary malignant neoplasm of bone: Secondary | ICD-10-CM | POA: Diagnosis not present

## 2021-06-10 DIAGNOSIS — R531 Weakness: Secondary | ICD-10-CM | POA: Diagnosis not present

## 2021-06-12 DIAGNOSIS — J910 Malignant pleural effusion: Secondary | ICD-10-CM | POA: Diagnosis not present

## 2021-06-15 DIAGNOSIS — J90 Pleural effusion, not elsewhere classified: Secondary | ICD-10-CM | POA: Diagnosis not present

## 2021-06-16 DIAGNOSIS — Z978 Presence of other specified devices: Secondary | ICD-10-CM | POA: Diagnosis not present

## 2021-06-16 DIAGNOSIS — J90 Pleural effusion, not elsewhere classified: Secondary | ICD-10-CM | POA: Diagnosis not present

## 2021-06-16 DIAGNOSIS — R0602 Shortness of breath: Secondary | ICD-10-CM | POA: Diagnosis not present

## 2021-06-22 DIAGNOSIS — C7951 Secondary malignant neoplasm of bone: Secondary | ICD-10-CM | POA: Diagnosis not present

## 2021-06-22 DIAGNOSIS — Z9221 Personal history of antineoplastic chemotherapy: Secondary | ICD-10-CM | POA: Diagnosis not present

## 2021-06-22 DIAGNOSIS — Z936 Other artificial openings of urinary tract status: Secondary | ICD-10-CM | POA: Diagnosis not present

## 2021-06-22 DIAGNOSIS — G893 Neoplasm related pain (acute) (chronic): Secondary | ICD-10-CM | POA: Diagnosis not present

## 2021-06-22 DIAGNOSIS — J9690 Respiratory failure, unspecified, unspecified whether with hypoxia or hypercapnia: Secondary | ICD-10-CM | POA: Diagnosis not present

## 2021-06-22 DIAGNOSIS — G629 Polyneuropathy, unspecified: Secondary | ICD-10-CM | POA: Diagnosis not present

## 2021-06-22 DIAGNOSIS — Z923 Personal history of irradiation: Secondary | ICD-10-CM | POA: Diagnosis not present

## 2021-06-22 DIAGNOSIS — Z79899 Other long term (current) drug therapy: Secondary | ICD-10-CM | POA: Diagnosis not present

## 2021-06-22 DIAGNOSIS — G620 Drug-induced polyneuropathy: Secondary | ICD-10-CM | POA: Diagnosis not present

## 2021-06-22 DIAGNOSIS — T451X5A Adverse effect of antineoplastic and immunosuppressive drugs, initial encounter: Secondary | ICD-10-CM | POA: Diagnosis not present

## 2021-06-22 DIAGNOSIS — Z192 Hormone resistant malignancy status: Secondary | ICD-10-CM | POA: Diagnosis not present

## 2021-06-22 DIAGNOSIS — J910 Malignant pleural effusion: Secondary | ICD-10-CM | POA: Diagnosis not present

## 2021-07-03 DIAGNOSIS — J910 Malignant pleural effusion: Secondary | ICD-10-CM | POA: Diagnosis not present

## 2021-07-05 DIAGNOSIS — J9621 Acute and chronic respiratory failure with hypoxia: Secondary | ICD-10-CM | POA: Diagnosis not present

## 2021-07-05 DIAGNOSIS — J90 Pleural effusion, not elsewhere classified: Secondary | ICD-10-CM | POA: Diagnosis not present

## 2021-07-17 DIAGNOSIS — I10 Essential (primary) hypertension: Secondary | ICD-10-CM | POA: Diagnosis not present

## 2021-07-17 DIAGNOSIS — E7800 Pure hypercholesterolemia, unspecified: Secondary | ICD-10-CM | POA: Diagnosis not present

## 2021-08-17 DEATH — deceased
